# Patient Record
Sex: Male | Born: 1965 | Race: White | Hispanic: No | Marital: Married | Smoking: Former smoker
Health system: Southern US, Community
[De-identification: ages and names within clinical notes are randomized; demographics above are authoritative.]

## PROBLEM LIST (undated history)

## (undated) DIAGNOSIS — Z2821 Immunization not carried out because of patient refusal: Secondary | ICD-10-CM

## (undated) DIAGNOSIS — T7840XA Allergy, unspecified, initial encounter: Secondary | ICD-10-CM

## (undated) DIAGNOSIS — J45909 Unspecified asthma, uncomplicated: Secondary | ICD-10-CM

## (undated) DIAGNOSIS — Z5320 Procedure and treatment not carried out because of patient's decision for unspecified reasons: Secondary | ICD-10-CM

## (undated) DIAGNOSIS — Z87891 Personal history of nicotine dependence: Secondary | ICD-10-CM

## (undated) HISTORY — PX: NOSE SURGERY: SHX723

## (undated) HISTORY — PX: DENTAL SURGERY: SHX609

## (undated) HISTORY — PX: TONSILLECTOMY: SUR1361

## (undated) HISTORY — PX: BACK SURGERY: SHX140

## (undated) HISTORY — DX: Allergy, unspecified, initial encounter: T78.40XA

## (undated) HISTORY — PX: CERVICAL FUSION: SHX112

## (undated) HISTORY — PX: CARPAL TUNNEL RELEASE: SHX101

## (undated) HISTORY — DX: Immunization not carried out because of patient refusal: Z28.21

## (undated) HISTORY — DX: Personal history of nicotine dependence: Z87.891

## (undated) HISTORY — PX: VASECTOMY: SHX75

## (undated) HISTORY — DX: Procedure and treatment not carried out because of patient's decision for unspecified reasons: Z53.20

## (undated) HISTORY — DX: Unspecified asthma, uncomplicated: J45.909

---

## 1992-09-24 HISTORY — PX: COLONOSCOPY: SHX174

## 2012-12-10 ENCOUNTER — Ambulatory Visit (INDEPENDENT_AMBULATORY_CARE_PROVIDER_SITE_OTHER): Payer: BC Managed Care – PPO | Admitting: Family Medicine

## 2012-12-10 ENCOUNTER — Ambulatory Visit: Payer: BC Managed Care – PPO

## 2012-12-10 VITALS — BP 118/76 | HR 68 | Temp 97.9°F | Resp 12 | Ht 72.0 in | Wt 221.0 lb

## 2012-12-10 DIAGNOSIS — R062 Wheezing: Secondary | ICD-10-CM

## 2012-12-10 DIAGNOSIS — J45901 Unspecified asthma with (acute) exacerbation: Secondary | ICD-10-CM

## 2012-12-10 DIAGNOSIS — R05 Cough: Secondary | ICD-10-CM

## 2012-12-10 DIAGNOSIS — J309 Allergic rhinitis, unspecified: Secondary | ICD-10-CM

## 2012-12-10 DIAGNOSIS — R059 Cough, unspecified: Secondary | ICD-10-CM

## 2012-12-10 MED ORDER — PREDNISONE 20 MG PO TABS: ORAL_TABLET | ORAL | Status: DC

## 2012-12-10 MED ORDER — IPRATROPIUM BROMIDE 0.02 % IN SOLN
0.5000 mg | Freq: Once | RESPIRATORY_TRACT | Status: AC
  Administered 2012-12-10: 0.5 mg via RESPIRATORY_TRACT

## 2012-12-10 MED ORDER — ALBUTEROL SULFATE (2.5 MG/3ML) 0.083% IN NEBU: 2.5000 mg | INHALATION_SOLUTION | Freq: Four times a day (QID) | RESPIRATORY_TRACT | Status: DC | PRN

## 2012-12-10 MED ORDER — ALBUTEROL SULFATE (2.5 MG/3ML) 0.083% IN NEBU
2.5000 mg | INHALATION_SOLUTION | Freq: Once | RESPIRATORY_TRACT | Status: AC
  Administered 2012-12-10: 2.5 mg via RESPIRATORY_TRACT

## 2012-12-10 MED ORDER — FLUTICASONE PROPIONATE 50 MCG/ACT NA SUSP: 2.0000 | Freq: Every day | NASAL | Status: DC

## 2012-12-10 MED ORDER — ALBUTEROL SULFATE HFA 108 (90 BASE) MCG/ACT IN AERS: 2.0000 | INHALATION_SPRAY | RESPIRATORY_TRACT | Status: DC | PRN

## 2012-12-10 MED ORDER — METHYLPREDNISOLONE SODIUM SUCC 125 MG IJ SOLR
125.0000 mg | Freq: Once | INTRAMUSCULAR | Status: AC
  Administered 2012-12-10: 125 mg via INTRAMUSCULAR

## 2012-12-10 NOTE — Progress Notes (Signed)
  Subjective:    Patient ID: Kristopher Gomez, male    DOB: 01-Dec-1965, 47 y.o.   MRN: [id number]  HPI 47 year old male presents with 5 day history of acute asthma flare. States symptoms started on 12/06/12 and have progressively worsened.  Has history of asthma controlled with albuterol inhaler and nebulizer prn.  Triggered by his seasonal allergies and acute illnesses.  Normally takes loratadine and Flonase that do control his allergies well, but he has been off this "for a while."  He has been using his albuterol nebulizer q4-6hours which does help but only provides temporary relief.  Admits to nasal congestion and slight dry cough x 2 days.  No fever, chills, nausea, vomiting, sinus pain, otalgia, or headache.     Review of Systems  Constitutional: Negative for fever and chills.  HENT: Positive for congestion, rhinorrhea and postnasal drip. Negative for ear pain, sore throat and sinus pressure.   Respiratory: Positive for cough, shortness of breath and wheezing.   Cardiovascular: Negative for chest pain.  Gastrointestinal: Negative for vomiting.  Neurological: Negative for dizziness and headaches.       Objective:   Physical Exam  Constitutional: He is oriented to person, place, and time. He appears well-developed and well-nourished.  HENT:  Head: Normocephalic and atraumatic.  Right Ear: Hearing, tympanic membrane, external ear and ear canal normal.  Left Ear: Hearing, tympanic membrane, external ear and ear canal normal.  Mouth/Throat: Uvula is midline, oropharynx is clear and moist and mucous membranes are normal.  Eyes: Conjunctivae are normal.  Neck: Normal range of motion.  Cardiovascular: Normal rate, regular rhythm and normal heart sounds.   Pulmonary/Chest: Effort normal. He has wheezes (diffuse).  Wheezing improved significantly s/p nebulizer  Lymphadenopathy:    He has no cervical adenopathy.  Neurological: He is alert and oriented to person, place, and time.  Psychiatric:  He has a normal mood and affect. His behavior is normal. Judgment and thought content normal.     Patient reports improvement s/p nebulizer treatment.     [facility] reading (PRIMARY) by  Dr. Katrinka Blazing as no acute infiltration or consolidation.   Assessment & Plan:  1. Cough - Plan: DG Chest 2 View  -Due to RAD and allergies.   -Will treat conservatively. Ok to rx Levaquin 500 mg x 7 days if symptoms persist, unless development of fever or  hemoptysis.   2. Wheezing - Plan: methylPREDNISolone sodium succinate (SOLU-MEDROL) 125 mg/2 mL injection 125 mg, albuterol (PROVENTIL) (2.5 MG/3ML) 0.083% nebulizer solution 2.5 mg, ipratropium (ATROVENT) nebulizer solution 0.5 mg, predniSONE (DELTASONE) 20 MG tablet, albuterol (PROVENTIL HFA;VENTOLIN HFA) 108 (90 BASE) MCG/ACT inhaler, albuterol (PROVENTIL) (2.5 MG/3ML) 0.083% nebulizer solution  -Albuterol nebulizer q4-6hours as needed for wheezing  -Prednisone taper  3. Asthma with acute exacerbation - Plan: methylPREDNISolone sodium succinate (SOLU-MEDROL) 125 mg/2 mL injection 125 mg, predniSONE (DELTASONE) 20 MG tablet  -Continue current treatment plain  -Follow up if symptoms worsen or fail to improve.  4. Allergic rhinitis - Plan: fluticasone (FLONASE) 50 MCG/ACT nasal spray  -Continue Flonase and loratadine daily

## 2012-12-11 NOTE — Progress Notes (Signed)
History and physical exam reviewed in detail with Rhoderick Moody, PA-C.  CXR reviewed. Agree with current assessment and plan.  RTC for acute worsening.

## 2012-12-22 ENCOUNTER — Encounter: Payer: Self-pay | Admitting: Medical

## 2012-12-22 ENCOUNTER — Ambulatory Visit (INDEPENDENT_AMBULATORY_CARE_PROVIDER_SITE_OTHER): Payer: BC Managed Care – PPO | Admitting: Medical

## 2012-12-22 VITALS — BP 120/82 | HR 80 | Temp 98.1°F | Resp 16 | Ht 72.0 in | Wt 228.0 lb

## 2012-12-22 DIAGNOSIS — J45909 Unspecified asthma, uncomplicated: Secondary | ICD-10-CM

## 2012-12-22 DIAGNOSIS — J309 Allergic rhinitis, unspecified: Secondary | ICD-10-CM

## 2012-12-22 MED ORDER — FLUTICASONE PROPIONATE 50 MCG/ACT NA SUSP: 2.0000 | Freq: Every day | NASAL | Status: DC

## 2012-12-22 MED ORDER — BUDESONIDE-FORMOTEROL FUMARATE 160-4.5 MCG/ACT IN AERO: 2.0000 | INHALATION_SPRAY | Freq: Two times a day (BID) | RESPIRATORY_TRACT | Status: DC

## 2012-12-22 MED ORDER — VENTOLIN HFA 108 (90 BASE) MCG/ACT IN AERS: 2.0000 | INHALATION_SPRAY | Freq: Four times a day (QID) | RESPIRATORY_TRACT | Status: DC | PRN

## 2012-12-22 MED ORDER — MONTELUKAST SODIUM 10 MG PO TABS: 10.0000 mg | ORAL_TABLET | Freq: Every day | ORAL | Status: DC

## 2012-12-22 NOTE — Patient Instructions (Signed)
For better asthma control  Begin Singulair tablet daily  continue loratadine allergy pill daily or switch to Zyrtec daily  Continue Flonase nasal spray daily  Continue nasal salt water rinse daily such as in the morning and at bedtime  Begin Symbicort preventative inhaler, 2 puffs twice daily for PREVENTION  The Albutoerl/Ventolin is a RESCUE or emergency inhaler for wheezing and shortness of breath.   Recheck in 1 month.

## 2012-12-22 NOTE — Progress Notes (Signed)
Subjective: Here as a new patient today to establish care and deal with breathing issues.   Was seeing Dr. Pamala Hurry, primary care prior.    He has hx/o asthma.  His wife notes that he has problems breathing, raspy and wheezing at night.   Went to Urgent Care recently for asthma flare.  Just finished course of prednisone Friday for asthma flare.  Also started taking OTC supplement Yimin recently to help.   Has been on "a lot of prednisone" in the past.    He reports wheezing day and night. Diagnosed with asthma at 73 wk old.  Has seen asthma doctor, Dr. Ranell Patrick in the past.  He is former smoker.  Quite 17 years ago.  At times sinus and allergy problems are bad.    routinely uses flonase nasal spray, loratadine oral OTC.  Takes Turmic, Diatomaceous earth liquids, Yimin.  Has a pit bull and a min pin dog at home.  No cats at home.  No birds in the home.  Has carpet in bedrooms.  Dose not wear masks with cleaning or mowing.   In teh past was on Singulair.  Has used Proair and ventolin, likes ventolin better.  Has used Advair in the past.  Has used another red steroid inhaler starts with a D.     Past Medical History  Diagnosis Date  . Allergy   . Asthma    ROS as in subjective:  Objective: Filed Vitals:   12/22/12 1457  BP: 120/82  Pulse: 80  Temp: 98.1 F (36.7 C)  Resp: 16    General appearance: alert, no distress, WD/WN, white male, overweight HEENT: normocephalic, sclerae anicteric, right TM with serous fluid, left TM pearly, nares with turbinate edema, clear discharge, septum deviated to the left, pharynx normal Oral cavity: MMM, no lesions Neck: supple, no lymphadenopathy, no thyromegaly, no masses Heart: RRR, normal S1, S2, no murmurs Lungs: somewhat muffled, but overall clear, faint lower expiratory wheeze, no rhonchi, no rales Abdomen: +bs, soft, non tender, non distended, no masses, no hepatomegaly, no splenomegaly Pulses: 2+ symmetric, upper and lower extremities, normal cap  refill  Assessment: Encounter Diagnoses  Name Primary?  Marland Kitchen Asthma, chronic Yes  . Allergic rhinitis    Plan: He was last on Qvar without improvement in 07/2012.  reviewed recent urgent care note.  Begin Symbicort, gave demo, gave sample, begin Singulair, c/t Loratadine, c/t Flonase, c/t nasal saline daily, use mask when cleaning and mowing.  Avoid triggers where possible.  reviewed prior records.   Recheck 31mo.

## 2013-01-07 ENCOUNTER — Encounter: Payer: Self-pay | Admitting: Internal Medicine

## 2013-01-21 ENCOUNTER — Ambulatory Visit (INDEPENDENT_AMBULATORY_CARE_PROVIDER_SITE_OTHER): Payer: BC Managed Care – PPO | Admitting: Medical

## 2013-01-21 ENCOUNTER — Encounter: Payer: Self-pay | Admitting: Medical

## 2013-01-21 VITALS — BP 120/80 | HR 82 | Temp 98.2°F | Resp 16 | Wt 220.0 lb

## 2013-01-21 DIAGNOSIS — J309 Allergic rhinitis, unspecified: Secondary | ICD-10-CM

## 2013-01-21 DIAGNOSIS — J45909 Unspecified asthma, uncomplicated: Secondary | ICD-10-CM

## 2013-01-21 NOTE — Patient Instructions (Signed)
Please call insurer about coverage or their "preferred" combination asthma controller - Advair, Symbicort, and Dulera.   Let me know which one you need to go with.    Consider different pharmacy.     So, for now,  Continue Singulair, continue loratadine, continue Flonase nasal, continue nasal saline rinses/flushes  continue wearing mask when outside mowing  Either continue Symbicort, 2 puffs twice or Advair 1 puff twice daily, rinse mouth out with water after use.  Use the albuterol inhaler as needed   As summer approaches, if you are having to use albuterol less than twice per week or no night time symptoms, then we can try going without the Symbicort in general.

## 2013-01-21 NOTE — Progress Notes (Signed)
Subjective: Here for recheck on asthma and allergies.  I saw him as new patient 12/22/12 for same.  Since last visit been using the regimen we discussed.   Was using the Symbicort sample, but insurance denied the prescription .  Overall is much better, less wheezing and SOB since doing this regimen.  He is using albuterol 2-3 times per week once daily more of a preventative measure, not due to wheezing/SOB.    Diagnosed with asthma at 43 wk old.  Has seen asthma doctor, Dr. Ranell Patrick in the past.  He is former smoker.  Quite 17 years ago.  At times sinus and allergy problems are bad.  Has a pit bull and a min pin dog at home.  No cats at home.  No birds in the home.  Has carpet in bedrooms.  Dose not wear masks with cleaning or mowing.    Past Medical History  Diagnosis Date  . Allergy   . Asthma    ROS as in subjective:  Objective: Filed Vitals:   01/21/13 1043  BP: 120/80  Pulse: 82  Temp: 98.2 F (36.8 C)  Resp: 16    General appearance: alert, no distress, WD/WN HEENT: normocephalic, sclerae anicteric, TMs pearly, nares patent without edema,no discharge, septum deviated to the left, pharynx normal Oral cavity: MMM, no lesions Neck: supple, no lymphadenopathy, no thyromegaly, no masses Heart: RRR, normal S1, S2, no murmurs Lungs: clear, no wheeze, no rhonchi, no rales   Assessment: Encounter Diagnoses  Name Primary?  Marland Kitchen Unspecified asthma Yes  . Allergic rhinitis      Plan: Patient Instructions  Please call insurer about coverage or their "preferred" combination asthma controller - Advair, Symbicort, and Dulera.   Let me know which one you need to go with.    Consider different pharmacy.     So, for now,  Continue Singulair, continue loratadine, continue Flonase nasal, continue nasal saline rinses/flushes  continue wearing mask when outside mowing  Either continue Symbicort, 2 puffs twice or Advair 1 puff twice daily, rinse mouth out with water after use.  Use the  albuterol inhaler as needed   As summer approaches, if you are having to use albuterol less than twice per week or no night time symptoms, then we can try going without the Symbicort in general.

## 2013-03-23 ENCOUNTER — Other Ambulatory Visit: Payer: Self-pay | Admitting: Medical

## 2013-03-23 ENCOUNTER — Telehealth: Payer: Self-pay | Admitting: Medical

## 2013-03-23 MED ORDER — MONTELUKAST SODIUM 10 MG PO TABS: 10.0000 mg | ORAL_TABLET | Freq: Every day | ORAL | Status: DC

## 2013-03-23 MED ORDER — VENTOLIN HFA 108 (90 BASE) MCG/ACT IN AERS: 2.0000 | INHALATION_SPRAY | Freq: Four times a day (QID) | RESPIRATORY_TRACT | Status: DC | PRN

## 2013-03-23 MED ORDER — BUDESONIDE-FORMOTEROL FUMARATE 160-4.5 MCG/ACT IN AERO: 2.0000 | INHALATION_SPRAY | Freq: Two times a day (BID) | RESPIRATORY_TRACT | Status: DC

## 2013-03-23 MED ORDER — FLUTICASONE PROPIONATE 50 MCG/ACT NA SUSP: 2.0000 | Freq: Every day | NASAL | Status: DC

## 2013-03-24 NOTE — Telephone Encounter (Signed)
LM

## 2013-04-01 ENCOUNTER — Telehealth: Payer: Self-pay | Admitting: Internal Medicine

## 2013-04-01 MED ORDER — FLUTICASONE PROPIONATE 50 MCG/ACT NA SUSP: 2.0000 | Freq: Every day | NASAL | Status: DC

## 2013-04-01 MED ORDER — VENTOLIN HFA 108 (90 BASE) MCG/ACT IN AERS: 2.0000 | INHALATION_SPRAY | Freq: Four times a day (QID) | RESPIRATORY_TRACT | Status: DC | PRN

## 2013-04-01 MED ORDER — MONTELUKAST SODIUM 10 MG PO TABS: 10.0000 mg | ORAL_TABLET | Freq: Every day | ORAL | Status: DC

## 2013-04-01 MED ORDER — BUDESONIDE-FORMOTEROL FUMARATE 160-4.5 MCG/ACT IN AERO: 2.0000 | INHALATION_SPRAY | Freq: Two times a day (BID) | RESPIRATORY_TRACT | Status: DC

## 2013-04-01 NOTE — Telephone Encounter (Signed)
[facility] so i sent them to [address]

## 2013-10-02 ENCOUNTER — Other Ambulatory Visit: Payer: Self-pay | Admitting: Medical

## 2013-10-05 ENCOUNTER — Other Ambulatory Visit: Payer: Self-pay | Admitting: Medical

## 2013-10-05 NOTE — Telephone Encounter (Signed)
PATIENT NEEDS A FOLLOW UP APPOINTMENT

## 2013-10-08 ENCOUNTER — Other Ambulatory Visit: Payer: Self-pay | Admitting: Medical

## 2013-10-08 NOTE — Telephone Encounter (Signed)
Left message for patient to return my call to schedule med check with Vincenza HewsShane as he was due August 2014, before I can refill his Symbicort.

## 2013-10-09 ENCOUNTER — Encounter: Payer: Self-pay | Admitting: Medical

## 2013-10-09 ENCOUNTER — Ambulatory Visit (INDEPENDENT_AMBULATORY_CARE_PROVIDER_SITE_OTHER): Payer: BC Managed Care – PPO | Admitting: Surgery

## 2013-10-09 ENCOUNTER — Encounter (INDEPENDENT_AMBULATORY_CARE_PROVIDER_SITE_OTHER): Payer: Self-pay | Admitting: Surgery

## 2013-10-09 ENCOUNTER — Telehealth: Payer: Self-pay | Admitting: Family Medicine

## 2013-10-09 ENCOUNTER — Ambulatory Visit (INDEPENDENT_AMBULATORY_CARE_PROVIDER_SITE_OTHER): Payer: BC Managed Care – PPO | Admitting: Medical

## 2013-10-09 VITALS — BP 122/84 | HR 80 | Temp 97.7°F | Resp 14 | Ht 73.0 in | Wt 211.8 lb

## 2013-10-09 VITALS — BP 110/80 | HR 78 | Temp 98.6°F | Resp 16 | Wt 209.0 lb

## 2013-10-09 DIAGNOSIS — K6289 Other specified diseases of anus and rectum: Secondary | ICD-10-CM

## 2013-10-09 DIAGNOSIS — K649 Unspecified hemorrhoids: Secondary | ICD-10-CM

## 2013-10-09 DIAGNOSIS — K645 Perianal venous thrombosis: Secondary | ICD-10-CM

## 2013-10-09 MED ORDER — TRAMADOL HCL 50 MG PO TABS
50.0000 mg | ORAL_TABLET | Freq: Four times a day (QID) | ORAL | Status: DC | PRN
Start: 2013-10-09 — End: 2013-11-16

## 2013-10-09 MED ORDER — NAPROXEN 500 MG PO TABS: 500.0000 mg | ORAL_TABLET | Freq: Two times a day (BID) | ORAL | Status: DC

## 2013-10-09 NOTE — Telephone Encounter (Signed)
[facility] @ 315 pm to Dr. Michaell CowingGross. [phone]. CLS

## 2013-10-09 NOTE — Progress Notes (Signed)
Subjective:     Patient ID: Kristopher Gomez, male   DOB: Apr 09, 1966, 48 y.o.   MRN: [id number]  HPI  Note: This dictation was prepared with Dragon/digital dictation along with [facility] technology. Any transcriptional errors that result from this process are unintentional.       Kristopher Gomez  August 09, 1966 [id number]  Patient Care Team: Jac Canavan, PA-C as PCP - General (Family Medicine)  This patient is a 48 y.o.male who presents today for surgical evaluation at the request of Kristopher Gomez.   Reason for visit: Painful thrombosed hemorrhoid  Pleasant male who works for [employer] with moderate lifting.  He has had rectal bleeding in the past.  Had a workup when he lived up in [place] including a normal colonoscopy.  It resolved.  Hemorrhoids are usually not been them bothersome.  However, last week he an episode of sharp pain.  He spelled swelling and in a month.  The pain has been exquisitely tender.  Based on concerns, surgical consultation requested.  He normally has a bowel movement every day.  Occasionally have some irregular bowels.  No personal nor family history of GI/colon cancer, inflammatory bowel disease, irritable bowel syndrome, allergy such as Celiac Sprue, dietary/dairy problems, colitis, ulcers nor gastritis.  No recent sick contacts/gastroenteritis.  No travel outside the country.  No changes in diet.  No dysphagia to solids or liquids.  No significant heartburn or reflux.  No hematochezia, hematemesis, coffee ground emesis.  No evidence of prior gastric/peptic ulceration.  No history of prior hemorrhoid surgery or anal rectal interventions.  No history of fissure/fistula/warts    Patient Active Problem List   Diagnosis Date Noted  . Hemorrhoids, internal, thrombosed 10/09/2013    Past Medical History  Diagnosis Date  . Allergy   . Asthma     Past Surgical History  Procedure Laterality Date  . Cervical fusion    . Vasectomy    . Carpal tunnel release    . Dental  surgery      all teeth  . Nose surgery      broke nose / sinus cavity    History   Social History  . Marital Status: Married    Spouse Name: N/A    Number of Children: N/A  . Years of Education: N/A   Occupational History  . Not on file.   Social History Main Topics  . Smoking status: Former Smoker    Quit date: 09/25/1995  . Smokeless tobacco: Never Used  . Alcohol Use: No  . Drug Use: No  . Sexual Activity: Yes    Birth Control/ Protection: Surgical   Other Topics Concern  . Not on file   Social History Narrative  . No narrative on file    Family History  Problem Relation Age of Onset  . Asthma Father   . Asthma Brother   . Cancer Daughter     ALL LEUKEMIA  . Cancer Maternal Aunt     breast  . Cancer Maternal Aunt     breast  . Cancer Maternal Aunt     breast    Current Outpatient Prescriptions  Medication Sig Dispense Refill  . albuterol (PROVENTIL HFA;VENTOLIN HFA) 108 (90 BASE) MCG/ACT inhaler Inhale 2 puffs into the lungs every 6 (six) hours as needed for wheezing.      . fluticasone (FLONASE) 50 MCG/ACT nasal spray Place 2 sprays into the nose daily.  48 g  1  . GARCINIA CAMBOGIA-CHROMIUM PO Take by mouth.      Marland Kitchen  montelukast (SINGULAIR) 10 MG tablet TAKE 1 TABLET (10 MG TOTAL) BY MOUTH AT BEDTIME.  90 tablet  0  . OVER THE COUNTER MEDICATION       . SYMBICORT 160-4.5 MCG/ACT inhaler INHALE 2 PUFFS INTO THE LUNGS 2 (TWO) TIMES DAILY.  6 g  0  . VENTOLIN HFA 108 (90 BASE) MCG/ACT inhaler Inhale 2 puffs into the lungs every 6 (six) hours as needed for wheezing.  3 Inhaler  0  . naproxen (NAPROSYN) 500 MG tablet Take 1 tablet (500 mg total) by mouth 2 (two) times daily with a meal.  40 tablet  1  . traMADol (ULTRAM) 50 MG tablet Take 1-2 tablets (50-100 mg total) by mouth every 6 (six) hours as needed for moderate pain or severe pain.  30 tablet  0   No current facility-administered medications for this visit.     Allergies  Allergen Reactions  .  Vicodin [Hydrocodone-Acetaminophen] Anaphylaxis  . Morphine And Related Nausea And Vomiting  . Penicillins     BP 122/84  Pulse 80  Temp(Src) 97.7 F (36.5 C) (Temporal)  Resp 14  Ht 6\' 1"  (1.854 m)  Wt 211 lb 12.8 oz (96.072 kg)  BMI 27.95 kg/m2  No results found.   Review of Systems  Constitutional: Negative for fever, chills and diaphoresis.  HENT: Negative for ear discharge, facial swelling, mouth sores, nosebleeds, sore throat and trouble swallowing.   Eyes: Negative for photophobia, discharge and visual disturbance.  Respiratory: Negative for choking, chest tightness, shortness of breath and stridor.   Cardiovascular: Negative for chest pain and palpitations.  Gastrointestinal: Positive for diarrhea, constipation and rectal pain. Negative for nausea, vomiting, abdominal pain, blood in stool, abdominal distention and anal bleeding.  Endocrine: Negative for cold intolerance and heat intolerance.  Genitourinary: Negative for dysuria, urgency, difficulty urinating and testicular pain.  Musculoskeletal: Negative for arthralgias, back pain, gait problem and myalgias.  Skin: Negative for color change, pallor, rash and wound.  Allergic/Immunologic: Negative for environmental allergies and food allergies.  Neurological: Negative for dizziness, speech difficulty, weakness, numbness and headaches.  Hematological: Negative for adenopathy. Does not bruise/bleed easily.  Psychiatric/Behavioral: Negative for hallucinations, confusion and agitation.       Objective:   Physical Exam  Constitutional: He is oriented to person, place, and time. He appears well-developed and well-nourished. No distress.  HENT:  Head: Normocephalic.  Mouth/Throat: Oropharynx is clear and moist. No oropharyngeal exudate.  Eyes: Conjunctivae and EOM are normal. Pupils are equal, round, and reactive to light. No scleral icterus.  Neck: Normal range of motion. Neck supple. No tracheal deviation present.    Cardiovascular: Normal rate, regular rhythm and intact distal pulses.   Pulmonary/Chest: Effort normal and breath sounds normal. No respiratory distress.  Abdominal: Soft. He exhibits no distension. There is no tenderness. Hernia confirmed negative in the right inguinal area and confirmed negative in the left inguinal area.  Genitourinary:     Exam done with assistance of male Medical Assistant in the room.  Perianal skin clean with good hygiene.  No pruritis.  No pilonidal disease.  No fissure.  No abscess/fistula.    Large R post prolapsed & thrombosed homorrhoid.  Normal sphincter tone.  Tolerates digital rectal exam.  No rectal masses.  Hemorrhoidal piles otherise mildly enlarged  Musculoskeletal: Normal range of motion. He exhibits no tenderness.  Lymphadenopathy:    He has no cervical adenopathy.       Right: No inguinal adenopathy present.  Left: No inguinal adenopathy present.  Neurological: He is alert and oriented to person, place, and time. No cranial nerve deficit. He exhibits normal muscle tone. Coordination normal.  Skin: Skin is warm and dry. No rash noted. He is not diaphoretic. No erythema. No pallor.  Psychiatric: He has a normal mood and affect. His behavior is normal. Judgment and thought content normal.       Assessment:     Large prolapsed thrombosed hemorrhoid     Plan:     I recommended incision and drainage with excision in the office.  He and his significant other agreed:  The anatomy & physiology of the anorectal region was discussed.  The pathophysiology of hemorrhoids and differential diagnosis was discussed.  Natural history progression  of worsening swelling with eventual resolution over weeks to months was discussed.   I stressed the importance of a bowel regimen to have daily soft bowel movements to minimize progression of disease.     The patient's symptoms are not adequately controlled.  Therefore, I recommended incision to drain the  thrombosed hemorrhoid..  I went over the technique, risks, benefits, and alternatives.   Questions were answered.  The patient expressed understanding & wished to proceed.  The patient was positioned in the lateral decubitus position.  I placed a field block of local anaesthetic.  Perianal & rectal examination was done.  I incised & decompressed the thrombosed hemorrhoid.  He large volume of redundant tissue in many clusters of thrombosed blood vessels.  Sharply debrided and the entire hemorrhoid pocket and external hemorrhoid component to a more normal size.  I believe generous anoderm.  I closed it with a 3-0 chromic running suture.The patient tolerated the procedure well.    Educational handouts further explaining the pathology, treatment options, and bowel regimen were given as well.

## 2013-10-09 NOTE — Progress Notes (Signed)
Subjective: Here for hemorrhoid.  He notes hx/o hemorrhoid.  First time in 6 years he has a big and painful hemorrhoid.   Wednesday 2 days ago had several hemorrhoids flare up, 2 went down, but this one is big and painful.  Pink coloration.  Gets nontender hemorrhoids frequently.   Usually uses preparation H with relief, but this one is very uncomfortable.   Normally has 2-3 BM daily, not hard and bulky.  No constipation issues.  He reports drinking lots of water and eating a lot of fiber daily.  No prior excision or thrombosed hemorrhoid.  Lifts boxes daily, works at [employer].  Past Medical History  Diagnosis Date  . Allergy   . Asthma    ROS as in subjective  Objective: Gen: wd, wn, nad Rectal: left of anus with large tense, tender hemorrhoid, pink with some friable appearance, but no purplish coloration, there are several other small hemorrhoids present as well, nontender, no obvious fissure   Assessment: Encounter Diagnoses  Name Primary?  . Hemorrhoids Yes  . Rectal pain     Assessment: Based on the numerous hemorrhoids, the large tense painful hemorrhoid, will refer to general surgery, got him appt today.

## 2013-10-09 NOTE — Patient Instructions (Signed)
ANORECTAL SURGERY: POST OP INSTRUCTIONS  1. Take your usually prescribed home medications unless otherwise directed. 2. DIET: Follow a light bland diet the first 24 hours after arrival home, such as soup, liquids, crackers, etc.  Be sure to include lots of fluids daily.  Avoid fast food or heavy meals as your are more likely to get nauseated.  Eat a low fat the next few days after surgery.   3. PAIN CONTROL: a. Pain is best controlled by a usual combination of three different methods TOGETHER: i. Ice/Heat ii. Over the counter pain medication iii. Prescription pain medication b. Most patients will experience some swelling and discomfort in the anus/rectal area. and incisions.  Ice packs or heat (30-60 minutes up to 6 times a day) will help. Use ice for the first few days to help decrease swelling and bruising, then switch to heat such as warm towels, sitz baths, warm baths, etc to help relax tight/sore spots and speed recovery.  Some people prefer to use ice alone, heat alone, alternating between ice & heat.  Experiment to what works for you.  Swelling and bruising can take several weeks to resolve.   c. It is helpful to take an over-the-counter pain medication regularly for the first few weeks.  Choose one of the following that works best for you: i. Naproxen (Aleve, etc)  Two 220mg tabs twice a day ii. Ibuprofen (Advil, etc) Three 200mg tabs four times a day (every meal & bedtime) iii. Acetaminophen (Tylenol, etc) 500-650mg four times a day (every meal & bedtime) d. A  prescription for pain medication (such as oxycodone, hydrocodone, etc) should be given to you upon discharge.  Take your pain medication as prescribed.  i. If you are having problems/concerns with the prescription medicine (does not control pain, nausea, vomiting, rash, itching, etc), please call us [phone] to see if we need to switch you to a different pain medicine that will work better for you and/or control your side effect  better. ii. If you need a refill on your pain medication, please contact your pharmacy.  They will contact our office to request authorization. Prescriptions will not be filled after 5 pm or on week-ends.  Use a Sitz Bath 4-8 times a day for relief A sitz bath is a warm water bath taken in the sitting position that covers only the hips and buttocks. It may be used for either healing or hygiene purposes. Sitz baths are also used to relieve pain, itching, or muscle spasms. The water may contain medicine. Moist heat will help you heal and relax.  HOME CARE INSTRUCTIONS  Take 3 to 4 sitz baths a day. 1. Fill the bathtub half full with warm water. 2. Sit in the water and open the drain a little. 3. Turn on the warm water to keep the tub half full. Keep the water running constantly. 4. Soak in the water for 15 to 20 minutes. 5. After the sitz bath, pat the affected area dry first. SEEK MEDICAL CARE IF:  You get worse instead of better. Stop the sitz baths if you get worse.   4. KEEP YOUR BOWELS REGULAR a. The goal is one bowel movement a day b. Avoid getting constipated.  Between the surgery and the pain medications, it is common to experience some constipation.  Increasing fluid intake and taking a fiber supplement (such as Metamucil, Citrucel, FiberCon, MiraLax, etc) 1-2 times a day regularly will usually help prevent this problem from occurring.  A mild   laxative (prune juice, Milk of Magnesia, MiraLax, etc) should be taken according to package directions if there are no bowel movements after 48 hours. c. Watch out for diarrhea.  If you have many loose bowel movements, simplify your diet to bland foods & liquids for a few days.  Stop any stool softeners and decrease your fiber supplement.  Switching to mild anti-diarrheal medications (Kayopectate, Pepto Bismol) can help.  If this worsens or does not improve, please call us.  5. Wound Care a. Remove your bandages the day after surgery.  Unless  discharge instructions indicate otherwise, leave your bandage dry and in place overnight.  Remove the bandage during your first bowel movement.   b. Allow the wound packing to fall out over the next few days.  You can trim exposed gauze / ribbon as it falls out.  You do not need to repack the wound unless instructed otherwise.  Wear an absorbent pad or soft cotton gauze in your underwear as needed to catch any drainage and help keep the area  c. Keep the area clean and dry.  Bathe / shower every day.  Keep the area clean by showering / bathing over the incision / wound.   It is okay to soak an open wound to help wash it.  Wet wipes or showers / gentle washing after bowel movements is often less traumatic than regular toilet paper. d. You may have some styrofoam-like soft packing in the rectum which will come out with the first bowel movement.  e. You will often notice bleeding with bowel movements.  This should slow down by the end of the first week of surgery f. Expect some drainage.  This should slow down, too, by the end of the first week of surgery.  Wear an absorbent pad or soft cotton gauze in your underwear until the drainage stops. 6. ACTIVITIES as tolerated:   a. You may resume regular (light) daily activities beginning the next day-such as daily self-care, walking, climbing stairs-gradually increasing activities as tolerated.  If you can walk 30 minutes without difficulty, it is safe to try more intense activity such as jogging, treadmill, bicycling, low-impact aerobics, swimming, etc. b. Save the most intensive and strenuous activity for last such as sit-ups, heavy lifting, contact sports, etc  Refrain from any heavy lifting or straining until you are off narcotics for pain control.   c. DO NOT PUSH THROUGH PAIN.  Let pain be your guide: If it hurts to do something, don't do it.  Pain is your body warning you to avoid that activity for another week until the pain goes down. d. You may drive when  you are no longer taking prescription pain medication, you can comfortably sit for long periods of time, and you can safely maneuver your car and apply brakes. e. You may have sexual intercourse when it is comfortable.  7. FOLLOW UP in our office a. Please call [facility] at [phone] to set up an appointment to see your surgeon in the office for a follow-up appointment approximately 2 weeks after your surgery. b. Make sure that you call for this appointment the day you arrive home to insure a convenient appointment time. 10. IF YOU HAVE DISABILITY OR FAMILY LEAVE FORMS, BRING THEM TO THE OFFICE FOR PROCESSING.  DO NOT GIVE THEM TO YOUR DOCTOR.        WHEN TO CALL US [phone]: 1. Poor pain control 2. Reactions / problems with new medications (rash/itching, nausea, etc)    3. Fever over 101.5 F (38.5 C) 4. Inability to urinate 5. Nausea and/or vomiting 6. Worsening swelling or bruising 7. Continued bleeding from incision. 8. Increased pain, redness, or drainage from the incision  The clinic staff is available to answer your questions during regular business hours (8:30am-5pm).  Please don't hesitate to call and ask to speak to one of our nurses for clinical concerns.   A surgeon from [facility] is always on call at the hospitals   If you have a medical emergency, go to the nearest emergency room or call 911.    [facility] [address], [address], [place], [place]  [address] ? MAIN: [phone] ? TOLL FREE: [phone] ? FAX [phone] [email or web address]  HEMORRHOIDS  The rectum is the last foot of your colon, and it naturally stretches to hold stool.  Hemorrhoidal piles are natural clusters of blood vessels that help the rectum and anal canal stretch to hold stool and allow bowel movements to eliminate feces.   Hemorrhoids are abnormally swollen blood vessels in the rectum.  Too much pressure in the rectum causes  hemorrhoids by forcing blood to stretch and bulge the walls of the veins, sometimes even rupturing them.  Hemorrhoids can become like varicose veins you might see on a person's legs.  Most people will develop a flare of hemorrhoids in their lifetime.  When bulging hemorrhoidal veins are irritated, they can swell, burn, itch, cause pain, and bleed.  Most flares will calm down gradually own within a few weeks.  However, once hemorrhoids are created, they are difficult to get rid of completely and tend to flare more easily than the first flare.   Fortunately, good habits and simple medical treatment usually control hemorrhoids well, and surgery is needed only in severe cases. Types of Hemorrhoids:  Internal hemorrhoids usually don't initially hurt or itch; they are deep inside the rectum and usually have no sensation. If they begin to push out (prolapse), pain and burning can occur.  However, internal hemorrhoids can bleed.  Anal bleeding should not be ignored since bleeding could come from a dangerous source like colorectal cancer, so persistent rectal bleeding should be investigated by a doctor, sometimes with a colonoscopy.  External hemorrhoids cause most of the symptoms - pain, burning, and itching. Nonirritated hemorrhoids can look like small skin tags coming out of the anus.   Thrombosed hemorrhoids can form when a hemorrhoid blood vessel bursts and causes the hemorrhoid to suddenly swell.  A purple blood clot can form in it and become an excruciatingly painful lump at the anus. Because of these unpleasant symptoms, immediate incision and drainage by a surgeon at an office visit can provide much relief of the pain.    PREVENTION Avoiding the most frequent causes listed below will prevent most cases of hemorrhoids: Constipation Hard stools Diarrhea  Constant sitting  Straining with bowel movements Sitting on the toilet for a long time  Severe coughing  episodes Pregnancy / Childbirth  Heavy  Lifting  Sometimes avoiding the above triggers is difficult:  How can you avoid sitting all day if you have a seated job? Also, we try to avoid coughing and diarrhea, but sometimes it's beyond your control.  Still, there are some practical hints to help: Keep the anal and genital area clean.  Moistened tissues such as flushable wet wipes are less irritating than toilet paper.  Using irrigating showers or bottle irrigation washing gently cleans this sensitive area.     Avoid dry toilet paper when cleaning after bowel movements.  . Keep the anal and genital area dry.  Lightly pat the rectal area dry.  Avoid rubbing.  Talcum or baby powders can help GET YOUR STOOLS SOFT.   This is the most important way to prevent irritated hemorrhoids.  Hard stools are like sandpaper to the anorectal canal and will cause more problems.  The goal: ONE SOFT BOWEL MOVEMENT A DAY!  BMs from every other day to 3 times a day is a tolerable range Treat coughing, diarrhea and constipation early since irritated hemorrhoids may soon follow.  If your main job activity is seated, always stand or walk during your breaks. Make it a point to stand and walk at least 5 minutes every hour and try to shift frequently in your chair to avoid direct rectal pressure.  Always exhale as you strain or lift. Don't hold your breath.  Do not delay or try to prevent a bowel movement when the urge is present. Exercise regularly (walking or jogging 60 minutes a day) to stimulate the bowels to move. No reading or other activity while on the toilet. If bowel movements take longer than 5 minutes, you are too constipated. AVOID CONSTIPATION Drink plenty of liquids (1 1/2 to 2 quarts of water and other fluids a day unless fluid restricted for another medical condition). Liquids that contain caffeine (coffee a, tea, soft drinks) can be dehydrating and should be avoided until constipation is controlled. Consider minimizing milk, as dairy products may be  constipating. Eat plenty of fiber (30g a day ideal, more if needed).  Fiber is the undigested part of plant food that passes into the colon, acting as "natures broom" to encourage bowel motility and movement.  Fiber can absorb and hold large amounts of water. This results in a larger, bulkier stool, which is soft and easier to pass.  Eating foods high in fiber - 12 servings - such as  Vegetables: Root (potatoes, carrots, turnips), Leafy green (lettuce, salad greens, celery, spinach), High residue (cabbage, broccoli, etc.) Fruit: Fresh, Dried (prunes, apricots, cherries), Stewed (applesauce)  Whole grain breads, pasta, whole wheat Bran cereals, muffins, etc. Consider adding supplemental bulking fiber which retains large volumes of water: Psyllium ground seeds (native plant from [place])--available as Metamucil, Konsyl, Effersyllium, Per Diem Fiber, or the less expensive generic forms.  Citrucel  (methylcellulose wood fiber) . FiberCon (Polycarbophil) Polyethylene Glycol - and "artificial" fiber commonly called Miralax or Glycolax.  It is helpful for people with gassy or bloated feelings with regular fiber Flax Seed - a less gassy natural fiber  Laxatives can be useful for a short period if constipation is severe Osmotics (Milk of Magnesia, Fleets Phospho-Soda, Magnesium Citrate)  Stimulants (Senokot,   Castor Oil,  Dulcolax, Ex-Lax)    Laxatives are not a good long-term solution as it can stress the bowels and cause too much mineral loss and dehydration.   Avoid taking laxatives for more than 7 days in a row.  AVOID DIARRHEA Switch to liquids and simpler foods for a few days to avoid stressing your intestines further. Avoid dairy products (especially milk & ice cream) for a short time.  The intestines often can lose the ability to digest lactose when stressed. Avoid foods that cause gassiness or bloating.  Typical foods include beans and other legumes, cabbage, broccoli, and dairy foods.   Every person has some sensitivity to other foods, so listen to your body and avoid those foods that trigger problems for   you. Adding fiber (Citrucel, Metamucil, FiberCon, Flax seed, Miralax) gradually can help thicken stools by absorbing excess fluid and retrain the intestines to act more normally.  Slowly increase the dose over a few weeks.  Too much fiber too soon can backfire and cause cramping & bloating. Probiotics (such as active yogurt, Align, etc) may help repopulate the intestines and colon with normal bacteria and calm down a sensitive digestive tract.  Most studies show it to be of mild help, though, and such products can be costly. Medicines: Bismuth subsalicylate (ex. Kayopectate, Pepto Bismol) every 30 minutes for up to 6 doses can help control diarrhea.  Avoid if pregnant. Loperamide (Immodium) can slow down diarrhea.  Start with two tablets (4mg total) first and then try one tablet every 6 hours.  Avoid if you are having fevers or severe pain.  If you are not better or start feeling worse, stop all medicines and call your doctor for advice Call your doctor if you are getting worse or not better.  Sometimes further testing (cultures, endoscopy, X-ray studies, bloodwork, etc) may be needed to help diagnose and treat the cause of the diarrhea. TREATMENT OF HEMORRHOID FLARE If these preventive measures fail, you must take action right away! Hemorrhoids are one condition that can be mild in the morning and become intolerable by nightfall. Most hemorrhoidal flares take several weeks to calm down.  These suggestions can help: Warm soaks.  This helps more than any topical medication.  Use up to 8 times a day.  Usually sitz baths or sitting in a warm bathtub helps.  Sitting on moist warm towels are helpful.  Switching to ice packs/cool compresses can be helpful  Use a Sitz Bath 4-8 times a day for relief A sitz bath is a warm water bath taken in the sitting position that covers only the hips and  buttocks. It may be used for either healing or hygiene purposes. Sitz baths are also used to relieve pain, itching, or muscle spasms. The water may contain medicine. Moist heat will help you heal and relax.  HOME CARE INSTRUCTIONS  Take 3 to 4 sitz baths a day. 6. Fill the bathtub half full with warm water. 7. Sit in the water and open the drain a little. 8. Turn on the warm water to keep the tub half full. Keep the water running constantly. 9. Soak in the water for 15 to 20 minutes. 10. After the sitz bath, pat the affected area dry first. SEEK MEDICAL CARE IF:  You get worse instead of better. Stop the sitz baths if you get worse.  Normalize your bowels.  Extremes of diarrhea or constipation will make hemorrhoids worse.  One soft bowel movement a day is the goal.  Fiber can help get your bowels regular Wet wipes instead of toilet paper Pain control with a NSAID such as ibuprofen (Advil) or naproxen (Aleve) or acetaminophen (Tylenol) around the clock.  Narcotics are constipating and should be minimized if possible Topical creams contain steroids (bydrocortisone) or local anesthetic (xylocaine) can help make pain and itching more tolerable.   EVALUATION If hemorrhoids are still causing problems, you could benefit by an evaluation by a surgeon.  The surgeon will obtain a history and examine you.  If hemorrhoids are diagnosed, some therapies can be offered in the office, usually with an anoscope into the less sensitive area of the rectum: -injection of hemorrhoids (sclerotherapy) can scar the blood vessels of the swollen/enlarged hemorrhoids to help shrink them down to   a more normal size -rubber banding of the enlarged hemorrhoids to help shrink them down to a more normal size -drainage of the blood clot causing a thrombosed hemorrhoid,  to relieve the severe pain   While 90% of the time such problems from hemorrhoids can be managed without preceding to surgery, sometimes the hemorrhoids require a  operation to control the problem (uncontrolled bleeding, prolapse, pain, etc.).   This involves being placed under general anesthesia where the surgeon can confirm the diagnosis and remove, suture, or staple the hemorrhoid(s).  Your surgeon can help you treat the problem appropriately.    GETTING TO GOOD BOWEL HEALTH. Irregular bowel habits such as constipation and diarrhea can lead to many problems over time.  Having one soft bowel movement a day is the most important way to prevent further problems.  The anorectal canal is designed to handle stretching and feces to safely manage our ability to get rid of solid waste (feces, poop, stool) out of our body.  BUT, hard constipated stools can act like ripping concrete bricks and diarrhea can be a burning fire to this very sensitive area of our body, causing inflamed hemorrhoids, anal fissures, increasing risk is perirectal abscesses, abdominal pain/bloating, an making irritable bowel worse.     The goal: ONE SOFT BOWEL MOVEMENT A DAY!  To have soft, regular bowel movements:    Drink at least 8 tall glasses of water a day.     Take plenty of fiber.  Fiber is the undigested part of plant food that passes into the colon, acting s "natures broom" to encourage bowel motility and movement.  Fiber can absorb and hold large amounts of water. This results in a larger, bulkier stool, which is soft and easier to pass. Work gradually over several weeks up to 6 servings a day of fiber (25g a day even more if needed) in the form of: o Vegetables -- Root (potatoes, carrots, turnips), leafy green (lettuce, salad greens, celery, spinach), or cooked high residue (cabbage, broccoli, etc) o Fruit -- Fresh (unpeeled skin & pulp), Dried (prunes, apricots, cherries, etc ),  or stewed ( applesauce)  o Whole grain breads, pasta, etc (whole wheat)  o Bran cereals    Bulking Agents -- This type of water-retaining fiber generally is easily obtained each day by one of the following:   o Psyllium bran -- The psyllium plant is remarkable because its ground seeds can retain so much water. This product is available as Metamucil, Konsyl, Effersyllium, Per Diem Fiber, or the less expensive generic preparation in drug and health food stores. Although labeled a laxative, it really is not a laxative.  o Methylcellulose -- This is another fiber derived from wood which also retains water. It is available as Citrucel. o Polyethylene Glycol - and "artificial" fiber commonly called Miralax or Glycolax.  It is helpful for people with gassy or bloated feelings with regular fiber o Flax Seed - a less gassy fiber than psyllium   No reading or other relaxing activity while on the toilet. If bowel movements take longer than 5 minutes, you are too constipated   AVOID CONSTIPATION.  High fiber and water intake usually takes care of this.  Sometimes a laxative is needed to stimulate more frequent bowel movements, but    Laxatives are not a good long-term solution as it can wear the colon out. o Osmotics (Milk of Magnesia, Fleets phosphosoda, Magnesium citrate, MiraLax, GoLytely) are safer than  o Stimulants (Senokot, Castor Oil, Dulcolax,   Ex Lax)    o Do not take laxatives for more than 7days in a row.    IF SEVERELY CONSTIPATED, try a Bowel Retraining Program: o Do not use laxatives.  o Eat a diet high in roughage, such as bran cereals and leafy vegetables.  o Drink six (6) ounces of prune or apricot juice each morning.  o Eat two (2) large servings of stewed fruit each day.  o Take one (1) heaping tablespoon of a psyllium-based bulking agent twice a day. Use sugar-free sweetener when possible to avoid excessive calories.  o Eat a normal breakfast.  o Set aside 15 minutes after breakfast to sit on the toilet, but do not strain to have a bowel movement.  o If you do not have a bowel movement by the third day, use an enema and repeat the above steps.    Controlling diarrhea o Switch to liquids and  simpler foods for a few days to avoid stressing your intestines further. o Avoid dairy products (especially milk & ice cream) for a short time.  The intestines often can lose the ability to digest lactose when stressed. o Avoid foods that cause gassiness or bloating.  Typical foods include beans and other legumes, cabbage, broccoli, and dairy foods.  Every person has some sensitivity to other foods, so listen to our body and avoid those foods that trigger problems for you. o Adding fiber (Citrucel, Metamucil, psyllium, Miralax) gradually can help thicken stools by absorbing excess fluid and retrain the intestines to act more normally.  Slowly increase the dose over a few weeks.  Too much fiber too soon can backfire and cause cramping & bloating. o Probiotics (such as active yogurt, Align, etc) may help repopulate the intestines and colon with normal bacteria and calm down a sensitive digestive tract.  Most studies show it to be of mild help, though, and such products can be costly. o Medicines:   Bismuth subsalicylate (ex. Kayopectate, Pepto Bismol) every 30 minutes for up to 6 doses can help control diarrhea.  Avoid if pregnant.   Loperamide (Immodium) can slow down diarrhea.  Start with two tablets (4mg  total) first and then try one tablet every 6 hours.  Avoid if you are having fevers or severe pain.  If you are not better or start feeling worse, stop all medicines and call your doctor for advice o Call your doctor if you are getting worse or not better.  Sometimes further testing (cultures, endoscopy, X-ray studies, bloodwork, etc) may be needed to help diagnose and treat the cause of the diarrhea.  Managing Pain  Pain after surgery or related to activity is often due to strain/injury to muscle, tendon, nerves and/or incisions.  This pain is usually short-term and will improve in a few months.   Many people find it helpful to do the following things TOGETHER to help speed the process of healing and  to get back to regular activity more quickly:  1. Avoid heavy physical activity a.  no lifting greater than 20 pounds b. Do not "push through" the pain.  Listen to your body and avoid positions and maneuvers than reproduce the pain c. Walking is okay as tolerated, but go slowly and stop when getting sore.  d. Remember: If it hurts to do it, then don't do it! 2. Take Anti-inflammatory medication  a. Take with food/snack around the clock for 1-2 weeks i. This helps the muscle and nerve tissues become less irritable and calm down faster b.  Choose ONE of the following over-the-counter medications: i. Naproxen 220mg  tabs (ex. Aleve) 1-2 pills twice a day  ii. Ibuprofen 200mg  tabs (ex. Advil, Motrin) 3-4 pills with every meal and just before bedtime iii. Acetaminophen 500mg  tabs (Tylenol) 1-2 pills with every meal and just before bedtime 3. Use a Heating pad or Ice/Cold Pack a. 4-6 times a day b. May use warm bath/hottub  or showers 4. Try Gentle Massage and/or Stretching  a. at the area of pain many times a day b. stop if you feel pain - do not overdo it  Try these steps together to help you body heal faster and avoid making things get worse.  Doing just one of these things may not be enough.    If you are not getting better after two weeks or are noticing you are getting worse, contact our office for further advice; we may need to re-evaluate you & see what other things we can do to help.

## 2013-10-12 ENCOUNTER — Telehealth (INDEPENDENT_AMBULATORY_CARE_PROVIDER_SITE_OTHER): Payer: Self-pay

## 2013-10-12 DIAGNOSIS — K649 Unspecified hemorrhoids: Secondary | ICD-10-CM

## 2013-10-12 MED ORDER — HYDROCORTISONE 2.5 % RE CREA: 1.0000 "application " | TOPICAL_CREAM | Freq: Two times a day (BID) | RECTAL | Status: DC

## 2013-10-12 NOTE — Telephone Encounter (Signed)
Called pt to notify him that I e-prescribed Anusol HC 2.5% per Dr Micha[facility]Gross [facility]. The pt understands.

## 2013-10-12 NOTE — Telephone Encounter (Signed)
Okay to call in some Anusol-HC 2.5% topically 4 times a day when necessary.  Refill x1

## 2013-10-12 NOTE — Addendum Note (Signed)
Addended by: Ethlyn GallerySPILLERS, Montie Swiderski on: 10/12/2013 02:22 PM   Modules accepted: Orders

## 2013-10-12 NOTE — Telephone Encounter (Signed)
Patient states the tramadol is causing N/V , he is asking for topical or supp. Medication instead of oral medication. Please advise  [facility] [phone]

## 2013-11-16 ENCOUNTER — Telehealth: Payer: Self-pay | Admitting: Family Medicine

## 2013-11-16 ENCOUNTER — Encounter: Payer: Self-pay | Admitting: Family Medicine

## 2013-11-16 ENCOUNTER — Ambulatory Visit (INDEPENDENT_AMBULATORY_CARE_PROVIDER_SITE_OTHER): Payer: BC Managed Care – PPO | Admitting: Family Medicine

## 2013-11-16 VITALS — BP 122/82 | HR 84 | Temp 98.4°F | Ht 73.0 in | Wt 212.0 lb

## 2013-11-16 DIAGNOSIS — R220 Localized swelling, mass and lump, head: Secondary | ICD-10-CM

## 2013-11-16 DIAGNOSIS — R221 Localized swelling, mass and lump, neck: Secondary | ICD-10-CM

## 2013-11-16 DIAGNOSIS — T7840XA Allergy, unspecified, initial encounter: Secondary | ICD-10-CM

## 2013-11-16 DIAGNOSIS — J45909 Unspecified asthma, uncomplicated: Secondary | ICD-10-CM

## 2013-11-16 MED ORDER — METHYLPREDNISOLONE ACETATE 40 MG/ML IJ SUSP
80.0000 mg | Freq: Once | INTRAMUSCULAR | Status: AC
  Administered 2013-11-16: 80 mg via INTRAMUSCULAR

## 2013-11-16 MED ORDER — MOMETASONE FURO-FORMOTEROL FUM 200-5 MCG/ACT IN AERO: 2.0000 | INHALATION_SPRAY | Freq: Two times a day (BID) | RESPIRATORY_TRACT | Status: DC

## 2013-11-16 MED ORDER — FIRST-DUKES MOUTHWASH MT SUSP: 5.0000 mL | Freq: Three times a day (TID) | OROMUCOSAL | Status: DC | PRN

## 2013-11-16 NOTE — Progress Notes (Signed)
Chief Complaint  Patient presents with  . Edema    lips have been swollen since this past Friday.    Swelling started 3 days ago--started in his lower gums, then spread to lips (upper and lower), tongue.  He had some throat swelling also, but that responded to benadryl.   The gum and lip swelling was also improved with benadryl.  Last dose was yesterday.  Currently has swelling in his lips, and feels like the tip of his tongue is a little swollen (further described as discomfort in the tip of the tongue, like after eating a lot of sour food).  Tongue is very sensitive to foods now.  He initially thought it was thrush (has had before, related to inhaled steroid use), but never saw any white develop, and then the lips swelled. He hasn't used steroid inhaler in 2 weeks (due to insurance issues).  He wears dentures--denies any new creams, adhesive.  Cleans them daily, no new products.  Denies any new foods, exposures.  He ate at home all weekend, not in restaurants.  No seafood, no new spices or new foods.  Denies any new supplements, vitamins.  He has h/o allergies and asthma, but has been off medications--stopped Symbicort 2 weeks ago due to insurance issue.  He states they will pay for Advair, but used that in the past and it was not effective.  He has not noticed any exacerbation of his breathing since stopping, but this time of year is usually his worst.  Denies fevers.  He has nasal congestion, runny nose (clear).  He hasn't used Flonase in about 2 weeks ago--makes it too dry. He also stopped using his singulair. He is having some sinus pressure in his cheeks, related to his gums swelling (per pt).    Past Medical History  Diagnosis Date  . Allergy   . Asthma    Past Surgical History  Procedure Laterality Date  . Cervical fusion    . Vasectomy    . Carpal tunnel release    . Dental surgery      all teeth  . Nose surgery      broke nose / sinus cavity   History   Social History  .  Marital Status: Married    Spouse Name: N/A    Number of Children: N/A  . Years of Education: N/A   Occupational History  . [employer]loading [employer]   Social History Main Topics  . Smoking status: Former Smoker    Quit date: 09/25/1995  . Smokeless tobacco: Never Used  . Alcohol Use: No  . Drug Use: No  . Sexual Activity: Yes    Birth Control/ Protection: Surgical   Other Topics Concern  . Not on file   Social History Narrative  . No narrative on file    Outpatient Encounter Prescriptions as of 11/16/2013  Medication Sig  . GARCINIA CAMBOGIA-CHROMIUM PO Take by mouth.  Marland Kitchen OVER THE COUNTER MEDICATION   . albuterol (PROVENTIL HFA;VENTOLIN HFA) 108 (90 BASE) MCG/ACT inhaler Inhale 2 puffs into the lungs every 6 (six) hours as needed for wheezing.  . fluticasone (FLONASE) 50 MCG/ACT nasal spray Place 2 sprays into the nose daily.  . hydrocortisone (ANUSOL-HC) 2.5 % rectal cream Place 1 application rectally 2 (two) times daily. Apply around anus for irritated & painful hemorrhoids 4 times a day  . montelukast (SINGULAIR) 10 MG tablet TAKE 1 TABLET (10 MG TOTAL) BY MOUTH AT BEDTIME.  . naproxen (NAPROSYN) 500 MG tablet Take  1 tablet (500 mg total) by mouth 2 (two) times daily with a meal.  . SYMBICORT 160-4.5 MCG/ACT inhaler INHALE 2 PUFFS INTO THE LUNGS 2 (TWO) TIMES DAILY.  . VENTOLIN HFA 108 (90 BASE) MCG/ACT inhaler Inhale 2 puffs into the lungs every 6 (six) hours as needed for wheezing.  . [DISCONTINUED] traMADol (ULTRAM) 50 MG tablet Take 1-2 tablets (50-100 mg total) by mouth every 6 (six) hours as needed for moderate pain or severe pain.   He is NOT taking any medications currently, other than the OTC supplements (herbal) listed above. also takes "black seed oil"  Allergies  Allergen Reactions  . Vicodin [Hydrocodone-Acetaminophen] Anaphylaxis  . Morphine And Related Nausea And Vomiting  . Penicillins   . Tramadol Nausea And Vomiting   ROS:  Denies nausea, vomiting, diarrhea,  skin rashes, hives, bleeding, bruising.  Denies cough, shortness of breath.  Denies dizziness, chest pain.  See HPI  PHYSICAL EXAM: BP 122/82  Pulse 84  Temp(Src) 98.4 F (36.9 C) (Oral)  Ht 6\' 1"  (1.854 m)  Wt 212 lb (96.163 kg)  BMI 27.98 kg/m2  Well developed male in no distress HEENT:  Edentulous.  No significant erythema, no ulcerations, gumline (tooth surface) nontender).  Area of discomfort is along lips and buccal mucosa (normal in appearance).  There is some soft tissue swelling of upper buccal mucosa (above upper lip).  No erythema. Tongue appears normal.  Mildly tender over upper maxilla (area of some mild swelling).  Remainder of sinuses nontender.  OP is without erythema or swelling posteriorly. Nasal mucosa is mildly edematous with clear mucus.  No erythema Neck: no lymphadenopathy, thyromegaly Heart: regular rate and rhythm without murmur Lungs: clear bilaterally.  Very mild intermittent wheeze with forced expiration.  Good air movement Skin: no rash  ASSESSMENT/PLAN:  Mouth swelling - Plan: methylPREDNISolone acetate (DEPO-MEDROL) injection 80 mg  Unspecified asthma(493.90) - Plan: mometasone-formoterol (DULERA) 200-5 MCG/ACT AERO, methylPREDNISolone acetate (DEPO-MEDROL) injection 80 mg  Allergic reaction - Plan: methylPREDNISolone acetate (DEPO-MEDROL) injection 80 mg  Allergic reaction--?etiology.  Recommended stopping all supplements (and can re-introduce them one at a time, if desired). Treat with depo-medrol today.   Take zyrtec 10mg  once daily.  Can continue to use benadryl (can limit to the evenings, due to sedation) if needed for recurrent swelling.  I also recommend using pepcid or zantac twice daily until the swelling has resolved  Return if increasing swelling or new symptoms develop.  Trial of magic mouthwash (phoned in)  Asthma--insurance no longer covering symbicort.  [facility].  Sample given.  He declined savings card--states that rx  for 3 month supply won't cost him out of pocket.

## 2013-11-16 NOTE — Telephone Encounter (Signed)
Called patient back to see if there was anything I could do for him, left message on his voicemail.

## 2013-11-16 NOTE — Patient Instructions (Signed)
Allergic reaction--?etiology.  Recommended stopping all supplements (and can re-introduce them one at a time, if desired). Treated with depo-medrol injection today.    Take zyrtec (cetirizine) 10mg  once daily.  Can continue to use benadryl (can limit to the evenings, due to sedation) if needed for recurrent swelling. I also recommend using pepcid or zantac twice daily until the swelling has resolved Return if increasing swelling or new symptoms develop.  Trial of magic mouthwash (phoned in--benadryl, mylanta and carafate, swish and spit three times daily before meals, and more often, if needed)

## 2013-11-17 ENCOUNTER — Other Ambulatory Visit: Payer: Self-pay | Admitting: Medical

## 2013-11-17 ENCOUNTER — Telehealth: Payer: Self-pay | Admitting: Medical

## 2013-11-17 MED ORDER — EPINEPHRINE 0.3 MG/0.3ML IJ SOAJ: 0.3000 mg | Freq: Once | INTRAMUSCULAR | Status: DC

## 2013-11-17 NOTE — Telephone Encounter (Signed)
epipen sent.

## 2013-11-17 NOTE — Telephone Encounter (Signed)
Pt called and stated that he saw Dr. Lynelle DoctorKnapp yesterday. He states when he got home he realized his epi pen had expired. You are his regular provider. [facility] [address]

## 2013-11-17 NOTE — Telephone Encounter (Signed)
Pt informed kds

## 2014-02-01 ENCOUNTER — Other Ambulatory Visit: Payer: Self-pay | Admitting: Physician Assistant

## 2014-02-08 ENCOUNTER — Telehealth: Payer: Self-pay | Admitting: Internal Medicine

## 2014-02-08 ENCOUNTER — Other Ambulatory Visit: Payer: Self-pay | Admitting: Family Medicine

## 2014-02-08 MED ORDER — ALBUTEROL SULFATE HFA 108 (90 BASE) MCG/ACT IN AERS: 2.0000 | INHALATION_SPRAY | Freq: Four times a day (QID) | RESPIRATORY_TRACT | Status: DC | PRN

## 2014-02-08 MED ORDER — FLUTICASONE PROPIONATE 50 MCG/ACT NA SUSP: 2.0000 | Freq: Every day | NASAL | Status: DC

## 2014-02-08 NOTE — Telephone Encounter (Signed)
RX SENT FOR 90 DAYS PER dAVID TYSINGER PAC. CLS

## 2014-02-08 NOTE — Telephone Encounter (Signed)
pls send the scripts

## 2014-02-08 NOTE — Telephone Encounter (Signed)
Pt needs a 3 month supply on flonase, and albuterol inhaler. [facility] [address]

## 2014-02-23 ENCOUNTER — Encounter: Payer: Self-pay | Admitting: Medical

## 2014-02-23 ENCOUNTER — Ambulatory Visit (INDEPENDENT_AMBULATORY_CARE_PROVIDER_SITE_OTHER): Payer: BC Managed Care – PPO | Admitting: Medical

## 2014-02-23 VITALS — BP 112/80 | HR 60 | Temp 97.8°F | Resp 14 | Wt 214.0 lb

## 2014-02-23 DIAGNOSIS — M7552 Bursitis of left shoulder: Secondary | ICD-10-CM

## 2014-02-23 DIAGNOSIS — M25519 Pain in unspecified shoulder: Secondary | ICD-10-CM

## 2014-02-23 DIAGNOSIS — M5380 Other specified dorsopathies, site unspecified: Secondary | ICD-10-CM

## 2014-02-23 DIAGNOSIS — M25512 Pain in left shoulder: Secondary | ICD-10-CM

## 2014-02-23 DIAGNOSIS — M67919 Unspecified disorder of synovium and tendon, unspecified shoulder: Secondary | ICD-10-CM

## 2014-02-23 DIAGNOSIS — M719 Bursopathy, unspecified: Secondary | ICD-10-CM

## 2014-02-23 DIAGNOSIS — M62830 Muscle spasm of back: Secondary | ICD-10-CM

## 2014-02-23 NOTE — Progress Notes (Signed)
Subjective: Kristopher Gomez presents today for left shoulder pain . He reports posterior shoulder pain x4 days, works as a Furniture conservator/restorer, and in the past few days has lifted over 100 boxes at work ranging from 60-160 pounds.  Denies particular injury, trauma, or fall, but he started having shoulder pain out of the blue.   Starts in the shoulder blade in the knee to shoulder blade, radiates down into the arm and into the neck, decreased range of motion, no shoulder abduction above 90, or flexion above 90.  He does have a history of cervical disc herniation prior neck surgery, 2004.  No residual problems from this. Has used Tumaric over-the-counter but no over-the-counter NSAIDs or prescription meds.  He is right handed. His wife has been massaging his arm and says is tender over supraspinatus and subscapularis.   No other aggravating or relieving factors. No other complaint  ROS as in subjective  Objective: GEN: well-developed well-nourished, no distress Skin: No bruising no erythema no ecchymosis MSK/back: Left Shoulder with tenderness over scapular spine, tender left rhomboids, tender generalized scapular area, tender supraspinatus and trapezius, shoulder flexion abduction limited due to pain to 90 degrees with active and passive ROM due to pain, decreased external range of motion due to pain, internal range of motion normal Arm is neurovascularly intact Upper extremity no edema Neck: Supple, nontender, normal range of motion, anterior surgical scar    Assessment: Encounter Diagnoses  Name Primary?  . Shoulder pain, left Yes  . Muscle spasm of back   . Bursitis of shoulder, left     Plan: Symptoms and exam findings suggestive of muscle spasm of back, shoulder strain, back strain, possible bursitis left shoulder-advised a few days of alternating ice and heat, massage therapy (wife is a massage therapist will be doing this), Aleve over-the-counter 2 tablets twice daily, increased water  intake, 2 days given work note.  Should gradually get back to normal in the next week or so. Return if not improving or worse

## 2014-02-25 ENCOUNTER — Encounter: Payer: Self-pay | Admitting: Medical

## 2014-02-25 ENCOUNTER — Telehealth: Payer: Self-pay | Admitting: Medical

## 2014-02-25 NOTE — Telephone Encounter (Signed)
done

## 2014-02-25 NOTE — Telephone Encounter (Signed)
Patient doesn't want any medications at all. He states that [employer] doesn't do work restrictions, so he states that he would do the work note out through the weekend and he feels like he will be ready for work Monday. CLS

## 2014-02-25 NOTE — Telephone Encounter (Signed)
I recommended ice/heat alternating, massage, and he declined medication that day.    At this point, I can give him short term prescription anti-inflammatory + some low dose pain medication if agreeable.  Does he want me to write modified work restrictions with return to work this weekend?    Worse case, we can write out through this weekend only.

## 2014-02-25 NOTE — Telephone Encounter (Signed)
Ok, note through weekend out of work

## 2014-03-12 ENCOUNTER — Telehealth: Payer: Self-pay | Admitting: Internal Medicine

## 2014-03-12 MED ORDER — ALBUTEROL SULFATE HFA 108 (90 BASE) MCG/ACT IN AERS: 2.0000 | INHALATION_SPRAY | Freq: Four times a day (QID) | RESPIRATORY_TRACT | Status: DC | PRN

## 2014-03-12 NOTE — Telephone Encounter (Signed)
Per shane- call in the ventolin

## 2014-03-12 NOTE — Telephone Encounter (Signed)
Send in med for brand name--ventolin

## 2014-03-12 NOTE — Telephone Encounter (Signed)
pls check on this.  I sent albuterol inhaler, it could have been different brand name, but should work the same.   Check with pharmacist.

## 2014-03-12 NOTE — Telephone Encounter (Signed)
Pt states that he did not get ventolin sent in to his pharmacy. Something else was sent in and it was not working. [facility] [address]

## 2014-03-12 NOTE — Telephone Encounter (Signed)
Called pt back and patient states that albuterol inhaler was sent in. He has to have the brand name VENTOLIN for it to work for him. Please send in VENTOLIN(THE BRAND NAME) not generic albuterol. [facility] [address]

## 2014-04-09 ENCOUNTER — Ambulatory Visit (INDEPENDENT_AMBULATORY_CARE_PROVIDER_SITE_OTHER): Payer: BC Managed Care – PPO | Admitting: Medical

## 2014-04-09 ENCOUNTER — Encounter: Payer: Self-pay | Admitting: Medical

## 2014-04-09 VITALS — BP 122/82 | HR 80 | Temp 98.0°F | Resp 16 | Wt 210.0 lb

## 2014-04-09 DIAGNOSIS — J4541 Moderate persistent asthma with (acute) exacerbation: Secondary | ICD-10-CM

## 2014-04-09 DIAGNOSIS — J329 Chronic sinusitis, unspecified: Secondary | ICD-10-CM

## 2014-04-09 DIAGNOSIS — J40 Bronchitis, not specified as acute or chronic: Secondary | ICD-10-CM

## 2014-04-09 DIAGNOSIS — J45901 Unspecified asthma with (acute) exacerbation: Secondary | ICD-10-CM

## 2014-04-09 MED ORDER — ALBUTEROL SULFATE HFA 108 (90 BASE) MCG/ACT IN AERS: 2.0000 | INHALATION_SPRAY | Freq: Four times a day (QID) | RESPIRATORY_TRACT | Status: DC | PRN

## 2014-04-09 MED ORDER — BUDESONIDE-FORMOTEROL FUMARATE 160-4.5 MCG/ACT IN AERO: 2.0000 | INHALATION_SPRAY | Freq: Two times a day (BID) | RESPIRATORY_TRACT | Status: DC

## 2014-04-09 MED ORDER — CLARITHROMYCIN 500 MG PO TABS: 500.0000 mg | ORAL_TABLET | Freq: Two times a day (BID) | ORAL | Status: DC

## 2014-04-09 MED ORDER — METHYLPREDNISOLONE (PAK) 4 MG PO TABS: ORAL_TABLET | ORAL | Status: DC

## 2014-04-09 NOTE — Progress Notes (Signed)
   Subjective:    Patient ID: Kristopher Gomez, male    DOB: 17-Aug-1966, 48 y.o.   MRN: [id number]  HPI  Patient is presenting for follow up on his asthma. Wheezing and coughing for the last month. Cough is productive in the last week producing yellow phlegm. Also has congestion, sinus pressure, itchy watery eyes, ear pressure. Patient reports symptoms daily, waking up twice a night in the last week, using albuterol every day multiple times a day and at night. Pharmacy switched his inhaler to a different one, is not working as well, used to use a blue inhaler that "worked 10 times better". Reports that insurance coverage changed, will cover Advair but patient states it didn't work and won't cover any other ICS. Denies fevers, headaches, throat pain, difficulty swallowing, chest pain. Patient is a non-smoker, not on any allergy medicine. Used to take Singulair and Flonase but not taking for the last few months, switched to natural remedies. No other aggravating or relieving factors.  Review of Systems As in subjective.     Objective:   Physical Exam  BP 122/82  Pulse 80  Temp(Src) 98 F (36.7 C) (Oral)  Resp 16  Wt 210 lb (95.255 kg)  SpO2 97%   General appearence: alert, no distress, WD/WN HEENT: normocephalic, sclerae anicteric, conjunctiva pink and moist, TMs mildly flat, frontal and maxillary sinuses tender, nares patent but dry mucous membranes, no discharge or erythema, pharynx normal, tonsils unremarkable Oral cavity: MMM, no lesions Neck: supple, no lymphadenopathy, no thyromegaly, no masses Heart: RRR, normal S1, S2, no murmurs Lungs: diffuse wheezes throughout, rhonchi, or rales Pulses: 2+ symmetric      Assessment & Plan:   Encounter Diagnoses  Name Primary?  Marland Kitchen. Asthma with acute exacerbation, moderate persistent Yes  . Sinobronchitis    We discussed his concerns, symptoms, asthma flare, medications.   Specific recommendations today include:  Begin Symbicort one  inhalation twice daily for asthma prevention  You may use albuterol inhaler 1 puff every 6 hours as needed for wheezing and shortness of breath  Begin Biaxin antibiotic twice daily for 10 day  Begin Medrol steroid Dosepak for asthma exacerbation  If not improving in the next few days then call back  If you get to the pharmacy and there is an issue with the medication, please make sure you have the pharmacist call me back with options that will be cheaper  Patient was seen in conjunction with PA student Wallis BambergMario Mani, and I have also evaluated and examined patient, agree with student's notes, student supervised by me.

## 2014-04-09 NOTE — Patient Instructions (Signed)
  Thank you for giving me the opportunity to serve you today.    Your diagnosis today includes: Encounter Diagnoses  Name Primary?  . Asthma with acute exacerbation, moderate persistent Yes  . Sinobronchitis      Specific recommendations today include:  Begin Symbicort one inhalation twice daily for asthma prevention  You may use albuterol inhaler 1 puff every 6 hours as needed for wheezing and shortness of breath  Begin Biaxin antibiotic twice daily for 10 day  Begin Medrol steroid Dosepak for asthma exacerbation  If not improving in the next few days then call back  If you get to the pharmacy and there is an issue with the medication, please make sure you have the pharmacist call me back with options that will be cheaper  Return 86moMayIhor ARiverside Ambulatory Surgery CenteNew Zeal20monMaIhor ALexington Regional Health CenteNew Zeal98monMaIhor AColorado River Medical CenteNew Zeal1monMaIhor ARiverside Methodist HospitaNew Zeal43monMayIhor ASummersville Regional Medical CenteNew Zeal51monMayeIhor AJames J. Peters Va Medical CenteNew Zeal49mLemon CNew Zeal61moIhor AUniversity Of [facility] CentraNew Zeal47monMayePoIhor AOrange Park Medical CenteNew Zeal65monIhor ASt Francis HospitaNew Zeal99monMayIhor AOakland Mercy HospitaNew Zeal93monMIhor AFcg [facility] Endoscopy CenteNew Zeal87monMaIhor ALake Surgery And Endoscopy Center LtNew ZealIhor ALincoln Medical Cente24moiMayeSIhor ASurgical Specialty Associates LLNew Zeal70monMayeChuIhor AKaiser Fnd Hosp - South SacramentNew Zeal21monMayeHIhor ABayonet Point Surgery Center LtNew Zeal29monMayeIhor AMount St. Mary'S HospitaNew Zeal26monIhor ACampus Eye Group AsNew Zeal39monIhor AWellstar Paulding HospitaNew Zeal24monIhor AEye Surgery [facility] IncorporateNew Zeal11monMaIhor ADenver Eye Surgery CenteNew Zeal22monMayIhor AAspen Valley HospitaNew Zeal11monMayeCatalIhor AColiseum Same Day Surgery Center LNew Zeal41monMayeIhor ALanterman Developmental CenteNew Zeal3monMayeLIhor AVibra Hospital [facility] Central CampuNew Zeal24monMIhor AAll City [facility] Center InNew Zeal72monMIhor AEastern Pl[facility] CampuNew Zeal82monMayIhor AEndoscopy Center Of South SacramentNew Zeal65monMayeIIhor ADha Endoscopy LLNew Zeal60monMIhor ANexus Specialty Hospital - The WoodlandNew Zeal97mBen LomNew Zealanddi CarbonDTEXTTAG>

## 2014-05-17 ENCOUNTER — Telehealth: Payer: Self-pay | Admitting: Medical

## 2014-05-17 ENCOUNTER — Other Ambulatory Visit: Payer: Self-pay | Admitting: Medical

## 2014-05-17 MED ORDER — ALBUTEROL SULFATE HFA 108 (90 BASE) MCG/ACT IN AERS: 2.0000 | INHALATION_SPRAY | Freq: Four times a day (QID) | RESPIRATORY_TRACT | Status: DC | PRN

## 2014-05-17 MED ORDER — MOMETASONE FURO-FORMOTEROL FUM 100-5 MCG/ACT IN AERO: 2.0000 | INHALATION_SPRAY | Freq: Two times a day (BID) | RESPIRATORY_TRACT | Status: DC

## 2014-05-17 NOTE — Telephone Encounter (Signed)
Pt needs refills on his Albuterol must be Ventolin.  Says ins doesn't cover Symbicort.  I called pharmacy & ins does not cover Symbicort, covered alternatives are Advair and Dulera.   Pt can't take Advair, it doesn't work.  W[facility] to try [facility].  [facility] each

## 2014-05-17 NOTE — Telephone Encounter (Signed)
Since we are changing to different inhaler [facility]  However, if he can get both for cheaper for 90 day, then call out the same for 90 day supply.    Since asthma currently not controlled, f/u 88mo if improved, sooner if not seeing improvement.

## 2014-05-18 ENCOUNTER — Other Ambulatory Visit: Payer: Self-pay | Admitting: Family Medicine

## 2014-05-18 MED ORDER — ALBUTEROL SULFATE HFA 108 (90 BASE) MCG/ACT IN AERS: 2.0000 | INHALATION_SPRAY | Freq: Four times a day (QID) | RESPIRATORY_TRACT | Status: DC | PRN

## 2014-05-18 MED ORDER — MOMETASONE FURO-FORMOTEROL FUM 100-5 MCG/ACT IN AERO: 2.0000 | INHALATION_SPRAY | Freq: Two times a day (BID) | RESPIRATORY_TRACT | Status: DC

## 2014-05-18 NOTE — Telephone Encounter (Signed)
I spoke with the patient and it would be cheaper to send in a 90 day supply so i resent the Rx for 90 day supply. CLS

## 2014-07-11 ENCOUNTER — Other Ambulatory Visit: Payer: Self-pay | Admitting: Medical

## 2014-08-02 ENCOUNTER — Ambulatory Visit (INDEPENDENT_AMBULATORY_CARE_PROVIDER_SITE_OTHER): Payer: BC Managed Care – PPO | Admitting: Medical

## 2014-08-02 ENCOUNTER — Telehealth: Payer: Self-pay | Admitting: Medical

## 2014-08-02 ENCOUNTER — Other Ambulatory Visit: Payer: Self-pay | Admitting: Family Medicine

## 2014-08-02 ENCOUNTER — Encounter: Payer: Self-pay | Admitting: Medical

## 2014-08-02 VITALS — BP 92/60 | HR 68 | Temp 98.0°F | Wt 226.0 lb

## 2014-08-02 DIAGNOSIS — J4540 Moderate persistent asthma, uncomplicated: Secondary | ICD-10-CM

## 2014-08-02 DIAGNOSIS — M25562 Pain in left knee: Secondary | ICD-10-CM

## 2014-08-02 DIAGNOSIS — M7652 Patellar tendinitis, left knee: Secondary | ICD-10-CM

## 2014-08-02 MED ORDER — ALBUTEROL SULFATE HFA 108 (90 BASE) MCG/ACT IN AERS: 2.0000 | INHALATION_SPRAY | Freq: Four times a day (QID) | RESPIRATORY_TRACT | Status: DC | PRN

## 2014-08-02 MED ORDER — MOMETASONE FURO-FORMOTEROL FUM 100-5 MCG/ACT IN AERO: 2.0000 | INHALATION_SPRAY | Freq: Two times a day (BID) | RESPIRATORY_TRACT | Status: DC

## 2014-08-02 NOTE — Telephone Encounter (Signed)
He will call back with info on where his wife works ( she is a Adult nursephysical therapist).   Refer to PT for iontophoresis and eval and management of left knee pain.

## 2014-08-02 NOTE — Telephone Encounter (Signed)
I fax over his information to [facility] and they will contact him for his appointment

## 2014-08-02 NOTE — Progress Notes (Signed)
Subjective: He reports left knee pain.  He reports left knee pain for a year, worse in the last month.  Has pain going up and down stairs, weightbearing, feels like when going downstairs he will hyperextend. His use tumeric herb for inflammation.  No swelling, no pain in other joints, no redness warmth.   Denies fall, injury, trauma.   Right knee is fine.   Works at [employer], up and down ladder/cage, lateral motion, on treadmills at work.  Also up and down stairs at home taking care of his pets.  No other aggravating or relieving factors. No other complete.  Here for refills on asthma medication.  Taking Dulera for prevention, rescue inhaler as needed, few times since we last saw him here.  Nonsmoker.  Doing fine on current medication.    ROS as in subjective   Objective: Filed Vitals:   08/02/14 1331  BP: 92/60  Pulse: 68  Temp: 98 F (36.7 C)    General appearance: alert, no distress, WD/WN,  HEENT: normocephalic, sclerae anicteric, TMs pearly, nares patent, no discharge or erythema, pharynx normal Oral cavity: MMM, no lesions Neck: supple, no lymphadenopathy, no thyromegaly, no masses Heart: RRR, normal S1, S2, no murmurs Lungs: CTA bilaterally, no wheezes, rhonchi, or rales Pulses: 2+ symmetric, upper and lower extremities, normal cap refill MSK: left knee with tenderness at lateral and medial joint line, tender over patellar tendon and insertion site, mild swelling at the tibial tuberosity, no laxity,wise ankle hip joint nontender, normal range motion, otherwise rest of bilat LE exam unremarkable Ext: no edema    Assessment: Encounter Diagnoses  Name Primary?  . Moderate persistent asthma, uncomplicated Yes  . Knee pain, acute, left   . Patellar tendinitis, left   . Medial knee pain, left     Plan: Asthma - controlled on current medicatoin, Dulera for prevention, albuterol HFA rescue inhaler,   Knee pain - tendinitis and inflammation, likely due to his mechanics at work.   Works at [employer] up and down the "cage", constantly moving up, down, laterally and on moving treadmills.  Advised ice, elevation, relative rest, knee sleeve, NSAID prn, and referral to physical therapy.    F/u 3-4wk, sooner prn.

## 2014-08-02 NOTE — Telephone Encounter (Signed)
[facility];  Electronic refill didn't go through properly.

## 2014-08-03 ENCOUNTER — Other Ambulatory Visit: Payer: Self-pay | Admitting: Medical

## 2014-08-30 ENCOUNTER — Telehealth: Payer: Self-pay | Admitting: Internal Medicine

## 2014-08-30 NOTE — Telephone Encounter (Signed)
If we don't have baseline xrays, we need to start there.   Please set him up for xray of the knee and shoulder that is continuing to give him pain, get copy of PT notes, and then get him back for recheck examination.

## 2014-08-30 NOTE — Telephone Encounter (Signed)
Wife called for pt stating that [facility] has [facility] and is still having trouble with his knee pain. PT told pt that he would recommend getting MRI done and that they were suppose to be sending the notes over. Pt also is wanting an MRI done for his shoulder since he is still having pain too with that. Pt can only go to [facility] due to his insurance

## 2014-08-31 ENCOUNTER — Other Ambulatory Visit: Payer: Self-pay | Admitting: Family Medicine

## 2014-08-31 DIAGNOSIS — M25512 Pain in left shoulder: Secondary | ICD-10-CM

## 2014-08-31 DIAGNOSIS — M25562 Pain in left knee: Secondary | ICD-10-CM

## 2014-08-31 NOTE — Telephone Encounter (Signed)
Wife was notified of Kristopher Gomez's recommendations, however she does not know where a [facility] is (so we will probably have to call insurance company to find out), and we have not received any PT notes, please get this.

## 2014-08-31 NOTE — Telephone Encounter (Signed)
[facility] said that they only cover CT, MRI, and etc. The rep. Said that they don't cover x-rays or ultrasounds and he can go any where he wants to it will go towards his deductible.

## 2014-08-31 NOTE — Telephone Encounter (Signed)
I called the wife and I told her that I spoke with the people at [facility] and that they pulled him up in the system and she said that Xrays and ultrasound can go anywhere it will be applied to the deductible. Patient states that they don't have an deductible. I told her that this is what [facility] told explain to me. I told her that we placed orders in the system through [facility] if he wants to go there for the xray's. The wife states that she will call and see what is going on.  I called [facility] # [phone]

## 2014-08-31 NOTE — Telephone Encounter (Signed)
I called over to [facility] [phone] and they will fax over OV notes.

## 2014-09-01 ENCOUNTER — Ambulatory Visit
Admission: RE | Admit: 2014-09-01 | Discharge: 2014-09-01 | Disposition: A | Discharge status: Home / Self Care | Payer: BC Managed Care – PPO | Source: Ambulatory Visit | Attending: Medical | Admitting: Medical

## 2014-09-01 DIAGNOSIS — M25512 Pain in left shoulder: Secondary | ICD-10-CM

## 2014-09-01 DIAGNOSIS — M25562 Pain in left knee: Secondary | ICD-10-CM

## 2014-09-02 NOTE — Progress Notes (Signed)
LM to [facility] 

## 2014-09-24 HISTORY — PX: KNEE ARTHROSCOPY: SHX127

## 2014-09-24 HISTORY — PX: SHOULDER SURGERY: SHX246

## 2014-10-02 ENCOUNTER — Other Ambulatory Visit: Payer: Self-pay | Admitting: Medical

## 2014-10-04 ENCOUNTER — Telehealth: Payer: Self-pay | Admitting: Medical

## 2014-10-04 NOTE — Telephone Encounter (Signed)
[facility]  I have seen him for asthma primarily.  Please scheduled a surgical physical clearance/and or routine physical visit, fasting for labs, etc.

## 2014-10-04 NOTE — Telephone Encounter (Signed)
Pt's wife dropped off a clearance from to be completed. I am sending back form from [employer]. Please call [phone] when ready.

## 2014-10-05 NOTE — Telephone Encounter (Signed)
Patient is aware and he made an appointment for his physical

## 2014-10-07 ENCOUNTER — Ambulatory Visit (INDEPENDENT_AMBULATORY_CARE_PROVIDER_SITE_OTHER): Payer: BLUE CROSS/BLUE SHIELD | Admitting: Medical

## 2014-10-07 ENCOUNTER — Telehealth: Payer: Self-pay | Admitting: Medical

## 2014-10-07 ENCOUNTER — Ambulatory Visit
Admission: RE | Admit: 2014-10-07 | Discharge: 2014-10-07 | Disposition: A | Discharge status: Home / Self Care | Payer: BLUE CROSS/BLUE SHIELD | Source: Ambulatory Visit | Attending: Medical | Admitting: Medical

## 2014-10-07 ENCOUNTER — Encounter: Payer: Self-pay | Admitting: Medical

## 2014-10-07 VITALS — BP 132/88 | HR 72 | Temp 98.0°F | Resp 15 | Ht 73.0 in | Wt 224.0 lb

## 2014-10-07 DIAGNOSIS — J4540 Moderate persistent asthma, uncomplicated: Secondary | ICD-10-CM

## 2014-10-07 DIAGNOSIS — Z0000 Encounter for general adult medical examination without abnormal findings: Secondary | ICD-10-CM

## 2014-10-07 DIAGNOSIS — Z87891 Personal history of nicotine dependence: Secondary | ICD-10-CM

## 2014-10-07 DIAGNOSIS — S43432A Superior glenoid labrum lesion of left shoulder, initial encounter: Secondary | ICD-10-CM

## 2014-10-07 DIAGNOSIS — S83207A Unspecified tear of unspecified meniscus, current injury, left knee, initial encounter: Secondary | ICD-10-CM

## 2014-10-07 DIAGNOSIS — Z01818 Encounter for other preprocedural examination: Secondary | ICD-10-CM

## 2014-10-07 LAB — COMPREHENSIVE METABOLIC PANEL
ALBUMIN: 4.3 g/dL (ref 3.5–5.2)
ALK PHOS: 63 U/L (ref 39–117)
ALT: 34 U/L (ref 0–53)
AST: 24 U/L (ref 0–37)
BUN: 13 mg/dL (ref 6–23)
CALCIUM: 9.4 mg/dL (ref 8.4–10.5)
CO2: 28 meq/L (ref 19–32)
Chloride: 101 mEq/L (ref 96–112)
Creat: 0.74 mg/dL (ref 0.50–1.35)
Glucose, Bld: 70 mg/dL (ref 70–99)
POTASSIUM: 4.7 meq/L (ref 3.5–5.3)
Sodium: 139 mEq/L (ref 135–145)
Total Bilirubin: 0.5 mg/dL (ref 0.2–1.2)
Total Protein: 6.9 g/dL (ref 6.0–8.3)

## 2014-10-07 LAB — POCT URINALYSIS DIPSTICK
Bilirubin, UA: NEGATIVE
Blood, UA: NEGATIVE
GLUCOSE UA: NEGATIVE
Ketones, UA: NEGATIVE
Leukocytes, UA: NEGATIVE
Nitrite, UA: NEGATIVE
PROTEIN UA: NEGATIVE
Spec Grav, UA: 1.015
Urobilinogen, UA: 1
pH, UA: 6

## 2014-10-07 LAB — CBC WITH DIFFERENTIAL/PLATELET
BASOS ABS: 0.1 10*3/uL (ref 0.0–0.1)
Basophils Relative: 1 % (ref 0–1)
Eosinophils Absolute: 0.7 10*3/uL (ref 0.0–0.7)
Eosinophils Relative: 6 % — ABNORMAL HIGH (ref 0–5)
HEMATOCRIT: 48.2 % (ref 39.0–52.0)
Hemoglobin: 16 g/dL (ref 13.0–17.0)
Lymphocytes Relative: 18 % (ref 12–46)
Lymphs Abs: 2.1 10*3/uL (ref 0.7–4.0)
MCH: 27.6 pg (ref 26.0–34.0)
MCHC: 33.2 g/dL (ref 30.0–36.0)
MCV: 83.2 fL (ref 78.0–100.0)
MONO ABS: 0.9 10*3/uL (ref 0.1–1.0)
MONOS PCT: 8 % (ref 3–12)
MPV: 10.5 fL (ref 8.6–12.4)
Neutro Abs: 7.8 10*3/uL — ABNORMAL HIGH (ref 1.7–7.7)
Neutrophils Relative %: 67 % (ref 43–77)
Platelets: 245 10*3/uL (ref 150–400)
RBC: 5.79 MIL/uL (ref 4.22–5.81)
RDW: 13.9 % (ref 11.5–15.5)
WBC: 11.7 10*3/uL — AB (ref 4.0–10.5)

## 2014-10-07 LAB — LIPID PANEL
CHOL/HDL RATIO: 2.9 ratio
CHOLESTEROL: 155 mg/dL (ref 0–200)
HDL: 53 mg/dL (ref >39)
LDL Cholesterol: 87 mg/dL (ref 0–99)
Triglycerides: 75 mg/dL (ref <150)
VLDL: 15 mg/dL (ref 0–40)

## 2014-10-07 LAB — TSH: TSH: 1.792 u[IU]/mL (ref 0.350–4.500)

## 2014-10-07 NOTE — Telephone Encounter (Signed)
Refer to asthma clinic for uncontrolled asthma, consideration of Xolair

## 2014-10-07 NOTE — Progress Notes (Signed)
Subjective:   HPI  Kristopher Gomez is a 49 y.o. male who presents for a physical/surgical clearance.     Having surgery for torn medial meniscus left knee.  Surgery is pending our clearance.  [facility], Dr. Jene EveryJeffrey Beane.  Has torn labrum of left shoulder, had MRI 08/25/14.   Needs referral for PT.    Medical care team includes: Ernst BreachYSINGER, DAVID SHANE, PA-C Dr. Jene EveryJeffrey Beane, [facility]   Preventative care: Last ophthalmology visit:2015 [place] Last dental visit:2015 no longer has one Last colonoscopy:years ago Last prostate exam: 2013 Last NFA:OZHYQMEKG:within last 3 years  Last labs:3 years ago  Prior vaccinations: TD or Tdap:current Influenza:denies Pneumococcal:denies Shingles/Zostavax:denies  Advanced directive: no Health care power of attorney:no Living will:no  Concerns: Asthma - last PFT 2.5 years ago.  On Dulera 2 puffs BID now, was on singular in the past.   Uses Albuterol 2 x per week.   Prior includes symbicort, advair, others.    Reviewed their medical, surgical, family, social, medication, and allergy history and updated chart as appropriate.  Past Medical History  Diagnosis Date  . Allergy   . Asthma   . Former smoker     quit 1994, prior was 20pack year+ smoker    Past Surgical History  Procedure Laterality Date  . Cervical fusion    . Vasectomy    . Carpal tunnel release    . Dental surgery      all teeth  . Nose surgery      broke nose / sinus cavity  . Tonsillectomy    . Colonoscopy  1994    rectal bleeding    History   Social History  . Marital Status: Married    Spouse Name: N/A    Number of Children: N/A  . Years of Education: N/A   Occupational History  . [employer]   Social History Main Topics  . Smoking status: Former Smoker -- 1.00 packs/day for 20 years    Quit date: 09/25/1995  . Smokeless tobacco: Never Used  . Alcohol Use: No  . Drug Use: No  . Sexual Activity: Yes    Birth Control/ Protection:  Surgical   Other Topics Concern  . Not on file   Social History Narrative   Married, works at [employer], walking, was doing a lot before shoulder and knee issues as of 09/2014.  8 children, grandchildren    Family History  Problem Relation Age of Onset  . Asthma Father   . Asthma Brother   . Cancer Daughter     ALL LEUKEMIA  . Cancer Maternal Aunt     breast  . Cancer Maternal Aunt     breast  . Cancer Maternal Aunt     breast  . Heart disease Mother [id number]    Afib  . Alcohol abuse Mother   . Diabetes Maternal Uncle   . Stroke Neg Hx   . Asthma Brother      Current outpatient prescriptions:  .  albuterol (PROVENTIL HFA;VENTOLIN HFA) 108 (90 BASE) MCG/ACT inhaler, Inhale 2 puffs into the lungs every 6 (six) hours as needed for wheezing. PATIENT NEEDS A 90 DAY SUPPLY, Disp: 18 g, Rfl: 0 .  DULERA 100-5 MCG/ACT AERO, INHALE 2 PUFFS INTO THE LUNGS 2 (TWO) TIMES DAILY., Disp: 1 Inhaler, Rfl: 0 .  EPINEPHrine (EPIPEN) 0.3 mg/0.3 mL SOAJ injection, Inject 0.3 mLs (0.3 mg total) into the muscle once., Disp: 1 Device, Rfl: 0 .  fexofenadine-pseudoephedrine (ALLEGRA-D 24) 180-240  MG per 24 hr tablet, Take 1 tablet by mouth daily., Disp: , Rfl:  .  mometasone-formoterol (DULERA) 100-5 MCG/ACT AERO, Inhale 2 puffs into the lungs 2 (two) times daily., Disp: 3 Inhaler, Rfl: 1 .  OVER THE COUNTER MEDICATION, , Disp: , Rfl:  .  PROAIR HFA 108 (90 BASE) MCG/ACT inhaler, INHALE 2 PUFFS INTO THE LUNGS EVERY 6 HOURS AS NEEDED FOR WHEEZING OR SHORTNES OF BREATH, Disp: 25.5 each, Rfl: 0  Allergies  Allergen Reactions  . Vicodin [Hydrocodone-Acetaminophen] Anaphylaxis  . Advair Diskus [Fluticasone-Salmeterol]     Can take Symbicort  . Morphine And Related Nausea And Vomiting  . Penicillins   . Tramadol Nausea And Vomiting    Review of Systems Constitutional: -fever, -chills, -sweats, -unexpected weight change, -decreased appetite, -fatigue Allergy: +sneezing, +itching, +congestion Dermatology:  -changing moles, --rash, -lumps ENT: -runny nose, -ear pain, -sore throat, -hoarseness, -sinus pain, -teeth pain, - ringing in ears, -hearing loss, -nosebleeds Cardiology: -chest pain, -palpitations, -swelling, -difficulty breathing when lying flat, +waking up short of breath Respiratory: -cough, +shortness of breath, -difficulty breathing with exercise or exertion, +wheezing, -coughing up blood Gastroenterology: -abdominal pain, -nausea, -vomiting, -diarrhea, -constipation, -blood in stool, -changes in bowel movement, -difficulty swallowing or eating Hematology: -bleeding, -bruising  Musculoskeletal: +joint aches, -muscle aches,+joint swelling, -back pain, +neck pain, -cramping, -changes in gait Ophthalmology: denies vision changes, eye redness, itching, discharge Urology: -burning with urination, -difficulty urinating, -blood in urine, -urinary frequency, -urgency, -incontinence Neurology: -headache, -weakness, -tingling, -numbness, -memory loss, -falls, -dizziness Psychology: -depressed mood, -agitation, -sleep problems     Objective:   Physical Exam  BP 132/88 mmHg  Pulse 72  Temp(Src) 98 F (36.7 C) (Oral)  Resp 15  Ht  (1.854 m)  Wt 224 lb (101.606 kg)  BMI 29.56 kg/m2  General appearance: alert, no distress, WD/WN, white male Skin: scattered benign appearing macules HEENT: normocephalic, conjunctiva/corneas normal, sclerae anicteric, PERRLA, EOMi, nares patent, no discharge or erythema, pharynx normal Oral cavity: MMM, tongue normal, teeth - upper and lower dentures Neck: supple, no lymphadenopathy, no thyromegaly, no masses, normal ROM, no bruits Chest: non tender, normal shape and expansion Heart: RRR, normal S1, S2, no murmurs Lungs: few scattered faint wheezes, no rhonchi, or rales Abdomen: +bs, soft, non tender, non distended, no masses, no hepatomegaly, no splenomegaly, no bruits Back: non tender, normal ROM, no scoliosis Musculoskeletal: did not examine left  shoulder and left knee due to pain, otherwise upper extremities non tender, no obvious deformity, normal ROM throughout, lower extremities non tender, no obvious deformity, normal ROM throughout Extremities: no edema, no cyanosis, no clubbing Pulses: 2+ symmetric, upper and lower extremities, normal cap refill Neurological: alert, oriented x 3, CN2-12 intact, strength normal upper extremities and lower extremities, sensation normal throughout, DTRs 2+ throughout, no cerebellar signs, gait normal Psychiatric: normal affect, behavior normal, pleasant  GU: normal male external genitalia, circumcised, nontender, no masses, no hernia, no lymphadenopathy Rectal: deferred   Adult ECG Report  Indication: surgery clearance  Rate: 65 bpm  Rhythm: normal sinus rhythm  QRS Axis: 81 degrees  PR Interval:  QRS Duration:  QTc:  Conduction Disturbances: none  Other Abnormalities: none  Patient's cardiac risk factors are: none.  EKG comparison: none  Narrative Interpretation: normal EKG    Assessment and Plan :    Encounter Diagnoses  Name Primary?  . Preoperative clearance Yes  . Routine general medical examination at a health care facility   . Former smoker   .  Asthma, chronic, moderate persistent, uncomplicated   . Meniscus tear, left, initial encounter   . Glenoid labrum tear, left, initial encounter    Physical exam - discussed healthy lifestyle, diet, exercise, preventative care, vaccinations, and addressed their concerns.   Former smoker Asthma - doing ok on Dulera and Albuterol, taking Allegra D.  Still not as well controlled as prior, but insurance has been limited in coverage.  May benefit from Xolair.   Referral for PFTs, CXR, and asthma specialist referral Mensicus tear - surgery pending PFTs and CXR Labrum tear - referral to PT Follow-up pending studies.

## 2014-10-07 NOTE — Telephone Encounter (Signed)
Please schedule baseline spirometry/PFTs, not full PFTs  He needs to go for chest x-ray  Refer to physical therapy but call him about this

## 2014-10-08 NOTE — Telephone Encounter (Signed)
Kristopher Gomez told me that pt is checking into this and will call us back with info.

## 2014-10-12 ENCOUNTER — Other Ambulatory Visit: Payer: Self-pay | Admitting: Medical

## 2014-10-12 ENCOUNTER — Encounter: Payer: Self-pay | Admitting: Pulmonary Disease

## 2014-10-12 ENCOUNTER — Ambulatory Visit (INDEPENDENT_AMBULATORY_CARE_PROVIDER_SITE_OTHER): Payer: BLUE CROSS/BLUE SHIELD | Admitting: Pulmonary Disease

## 2014-10-12 VITALS — BP 122/74 | HR 103 | Temp 97.4°F | Ht 73.0 in | Wt 231.8 lb

## 2014-10-12 DIAGNOSIS — J4540 Moderate persistent asthma, uncomplicated: Secondary | ICD-10-CM | POA: Insufficient documentation

## 2014-10-12 DIAGNOSIS — J45909 Unspecified asthma, uncomplicated: Secondary | ICD-10-CM

## 2014-10-12 MED ORDER — EPINEPHRINE 0.3 MG/0.3ML IJ SOAJ: 0.3000 mg | Freq: Once | INTRAMUSCULAR | Status: DC

## 2014-10-12 NOTE — Telephone Encounter (Signed)
Patient already had his appointment with asthma and allergy. Patient has his appointment with pulmonology next week I fax over his referral to [facility] for therapy on his shoulder and they will contact him for his appointment

## 2014-10-12 NOTE — Telephone Encounter (Signed)
Patient has his appointment and he is aware of the appointment and [facility]

## 2014-10-12 NOTE — Progress Notes (Signed)
   Subjective:    Patient ID: Kristopher Gomez, male    DOB: 02-21-1966, 49 y.o.   MRN: [id number]  HPI The patient is a 49 year old male who I've been asked to see for evaluation of asthma. The patient tells me that he was diagnosed with asthma as a child, and has been on medication since that time. He feels that it is been fairly well-controlled, and is had no significant increase in symptoms or frequent exacerbations. His last severe flare was approximately 20 years ago that resulted in a hospitalization, but he does have increasing asthma symptoms when he develops a "cold". He usually responds very well to prednisone when this occurs, and he took 2 doses last year. He denies any significant cough, congestion, or rattling in the chest currently. He is very satisfied with his exercise tolerance. He has had recent allergy testing, and apparently there were many positive results. However, the decision was made to treat this conservatively with medication rather than allergy vaccine. The patient has been on Singulair in the distant past and really did not feel it helped him. He also has a history of smoking for 20 years, but has not done so since 1997. He has had a recent chest x-ray that was really unremarkable, but has not had spirometry in almost 3 years. He is supposed to have upcoming knee and shoulder surgery, however was required to get pulmonary clearance before proceeding with this. He currently is taking dulera twice a day, but feels that Advair worked much better for him.   Review of Systems  Constitutional: Negative for fever and unexpected weight change.  HENT: Positive for congestion. Negative for dental problem, ear pain, nosebleeds, postnasal drip, rhinorrhea, sinus pressure, sneezing, sore throat and trouble swallowing.   Eyes: Negative for redness and itching.  Respiratory: Negative for cough, chest tightness, shortness of breath and wheezing.   Cardiovascular: Negative for palpitations and  leg swelling.  Gastrointestinal: Negative for nausea and vomiting.  Genitourinary: Negative for dysuria.  Musculoskeletal: Negative for joint swelling.  Skin: Negative for rash.  Neurological: Negative for headaches.  Hematological: Does not bruise/bleed easily.  Psychiatric/Behavioral: Negative for dysphoric mood. The patient is not nervous/anxious.        Objective:   Physical Exam Constitutional:  Well developed, no acute distress  HENT:  Nares patent without discharge, mild crusting and deviated septum  Oropharynx without exudate, palate and uvula are normal  Eyes:  Perrla, eomi, no scleral icterus  Neck:  No JVD, no TMG  Cardiovascular:  Normal rate, regular rhythm, no rubs or gallops.  No murmurs        Intact distal pulses  Pulmonary :  Normal breath sounds, no stridor or respiratory distress   No rales, rhonchi, a few wheezes  Abdominal:  Soft, nondistended, bowel sounds present.  No tenderness noted.   Musculoskeletal:  No lower extremity edema noted.  Lymph Nodes:  No cervical lymphadenopathy noted  Skin:  No cyanosis noted  Neurologic:  Alert, appropriate, moves all 4 extremities without obvious deficit.         Assessment & Plan:

## 2014-10-12 NOTE — Assessment & Plan Note (Signed)
The patient has a long-standing history of asthma dating back to childhood, but has been reasonably controlled on maintenance bronchodilator therapy. The patient currently feels that he is at a reasonable baseline, and does not overuse his rescue inhaler. He does feel that Advair helps his symptoms more so than dulera, but this is not covered on his insurance plan. He has had a recent chest x-ray that is really fairly clear, and his spirometry today does not show any definite airflow obstruction. At some mild air-trapping. Overall, the patient feels that he is at a fairly get baseline currently, and I see no reason why he cannot have the planned orthopedic surgery. He does not require continued follow-up, but I'm happy to see him again if he develops poor control of his asthma symptoms. More than likely this will be related to allergy symptoms.

## 2014-10-12 NOTE — Telephone Encounter (Signed)
Patient already had his chest xray

## 2014-10-12 NOTE — Patient Instructions (Signed)
Continue on your current asthma medication twice a day. You may need more aggressive treatment of your allergies if you have increased asthma symptoms.  I will be happy to see you again if you have significant asthma issues.  I see no reason why you cannot have your orthopedic surgery.

## 2014-10-13 NOTE — Progress Notes (Signed)
pls get me the surgery clearance form, should be in chart.

## 2014-10-16 ENCOUNTER — Other Ambulatory Visit: Payer: Self-pay | Admitting: Medical

## 2015-01-27 ENCOUNTER — Other Ambulatory Visit: Payer: Self-pay | Admitting: Medical

## 2015-05-13 ENCOUNTER — Other Ambulatory Visit: Payer: Self-pay | Admitting: Medical

## 2015-05-13 NOTE — Telephone Encounter (Signed)
shane is this okay to refill

## 2015-06-10 ENCOUNTER — Other Ambulatory Visit: Payer: Self-pay | Admitting: Medical

## 2015-06-13 ENCOUNTER — Other Ambulatory Visit: Payer: Self-pay | Admitting: Medical

## 2015-06-13 ENCOUNTER — Telehealth: Payer: Self-pay | Admitting: Medical

## 2015-06-13 ENCOUNTER — Other Ambulatory Visit: Payer: Self-pay

## 2015-06-13 MED ORDER — FLUTICASONE PROPIONATE 50 MCG/ACT NA SUSP: 2.0000 | Freq: Every day | NASAL | Status: DC

## 2015-06-13 NOTE — Telephone Encounter (Signed)
shane this is up to you if you want him to have a 90 day supply

## 2015-06-13 NOTE — Telephone Encounter (Signed)
Pt states he needs Flonase prescription sent into [facility] [address] but for 90 days not 30

## 2015-06-16 ENCOUNTER — Other Ambulatory Visit: Payer: Self-pay | Admitting: Medical

## 2015-10-16 ENCOUNTER — Emergency Department (HOSPITAL_COMMUNITY): Payer: BLUE CROSS/BLUE SHIELD

## 2015-10-16 ENCOUNTER — Emergency Department (HOSPITAL_COMMUNITY)
Admission: EM | Admit: 2015-10-16 | Discharge: 2015-10-16 | Disposition: A | Discharge status: Home / Self Care | Payer: BLUE CROSS/BLUE SHIELD | Attending: Emergency Medicine | Admitting: Emergency Medicine

## 2015-10-16 ENCOUNTER — Encounter (HOSPITAL_COMMUNITY): Payer: Self-pay | Admitting: *Deleted

## 2015-10-16 DIAGNOSIS — Z87891 Personal history of nicotine dependence: Secondary | ICD-10-CM | POA: Insufficient documentation

## 2015-10-16 DIAGNOSIS — Z7951 Long term (current) use of inhaled steroids: Secondary | ICD-10-CM | POA: Diagnosis not present

## 2015-10-16 DIAGNOSIS — Z79899 Other long term (current) drug therapy: Secondary | ICD-10-CM | POA: Diagnosis not present

## 2015-10-16 DIAGNOSIS — Y998 Other external cause status: Secondary | ICD-10-CM | POA: Diagnosis not present

## 2015-10-16 DIAGNOSIS — Y9289 Other specified places as the place of occurrence of the external cause: Secondary | ICD-10-CM | POA: Insufficient documentation

## 2015-10-16 DIAGNOSIS — J45909 Unspecified asthma, uncomplicated: Secondary | ICD-10-CM | POA: Insufficient documentation

## 2015-10-16 DIAGNOSIS — Z9889 Other specified postprocedural states: Secondary | ICD-10-CM | POA: Diagnosis not present

## 2015-10-16 DIAGNOSIS — M5417 Radiculopathy, lumbosacral region: Secondary | ICD-10-CM | POA: Diagnosis not present

## 2015-10-16 DIAGNOSIS — Z880 Allergy status to penicillin: Secondary | ICD-10-CM | POA: Diagnosis not present

## 2015-10-16 DIAGNOSIS — Y9389 Activity, other specified: Secondary | ICD-10-CM | POA: Insufficient documentation

## 2015-10-16 DIAGNOSIS — X58XXXA Exposure to other specified factors, initial encounter: Secondary | ICD-10-CM | POA: Insufficient documentation

## 2015-10-16 DIAGNOSIS — S3992XA Unspecified injury of lower back, initial encounter: Secondary | ICD-10-CM | POA: Diagnosis not present

## 2015-10-16 MED ORDER — OXYCODONE-ACETAMINOPHEN 5-325 MG PO TABS: 1.0000 | ORAL_TABLET | ORAL | Status: DC | PRN

## 2015-10-16 MED ORDER — OXYCODONE-ACETAMINOPHEN 5-325 MG PO TABS
1.0000 | ORAL_TABLET | Freq: Once | ORAL | Status: AC
  Administered 2015-10-16: 1 via ORAL

## 2015-10-16 MED ORDER — DEXAMETHASONE SODIUM PHOSPHATE 10 MG/ML IJ SOLN
10.0000 mg | Freq: Once | INTRAMUSCULAR | Status: AC
  Administered 2015-10-16: 10 mg via INTRAMUSCULAR
  Filled 2015-10-16: qty 1

## 2015-10-16 MED ORDER — DIAZEPAM 5 MG PO TABS
5.0000 mg | ORAL_TABLET | Freq: Once | ORAL | Status: AC
  Administered 2015-10-16: 5 mg via ORAL
  Filled 2015-10-16: qty 1

## 2015-10-16 MED ORDER — DIAZEPAM 5 MG PO TABS: 5.0000 mg | ORAL_TABLET | Freq: Three times a day (TID) | ORAL | Status: DC | PRN

## 2015-10-16 MED ORDER — NAPROXEN 500 MG PO TABS: 500.0000 mg | ORAL_TABLET | Freq: Two times a day (BID) | ORAL | Status: DC

## 2015-10-16 MED ORDER — OXYCODONE-ACETAMINOPHEN 5-325 MG PO TABS
ORAL_TABLET | ORAL | Status: AC
  Filled 2015-10-16: qty 1

## 2015-10-16 MED ORDER — HYDROMORPHONE HCL 1 MG/ML IJ SOLN
1.0000 mg | Freq: Once | INTRAMUSCULAR | Status: AC
  Administered 2015-10-16: 1 mg via INTRAMUSCULAR
  Filled 2015-10-16: qty 1

## 2015-10-16 NOTE — ED Notes (Signed)
Patient states he can take Percocet

## 2015-10-16 NOTE — ED Notes (Signed)
Patient transported to X-ray 
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
## 2015-10-16 NOTE — ED Notes (Signed)
Patient presents stating he started with lower back pain yesterday.  Patient walking straight up and has trouble sitting.  History of surgery

## 2015-10-16 NOTE — Discharge Instructions (Signed)
Naprosyn for pain. Percocet for severe pain. Valium for spasms. Rest. Follow up with primary care doctor or neurosurgery for further evaluation and treatment. Return if worsening symptoms.   Lumbosacral Radiculopathy Lumbosacral radiculopathy is a condition that involves the spinal nerves and nerve roots in the low back and bottom of the spine. The condition develops when these nerves and nerve roots move out of place or become inflamed and cause symptoms. CAUSES This condition may be caused by:  Pressure from a disk that bulges out of place (herniated disk). A disk is a plate of cartilage that separates bones in the spine.  Disk degeneration.  A narrowing of the bones of the lower back (spinal stenosis).  A tumor.  An infection.  An injury that places sudden pressure on the disks that cushion the bones of your lower spine. RISK FACTORS This condition is more likely to develop in:  Males aged 30-50 years.  Females aged 50-60 years.  People who lift improperly.  People who are overweight or live a sedentary lifestyle.  People who smoke.  People who perform repetitive activities that strain the spine. SYMPTOMS Symptoms of this condition include:  Pain that goes down from the back into the legs (sciatica). This is the most common symptom. The pain may be worse with sitting, coughing, or sneezing.  Pain and numbness in the arms and legs.  Muscle weakness.  Tingling.  Loss of bladder control or bowel control. DIAGNOSIS This condition is diagnosed with a physical exam and medical history. If the pain is lasting, you may have tests, such as:  MRI scan.  X-ray.  CT scan.  Myelogram.  Nerve conduction study. TREATMENT This condition is often treated with:  Hot packs and ice applied to affected areas.  Stretches to improve flexibility.  Exercises to strengthen back muscles.  Physical therapy.  Pain medicine.  A steroid injection in the spine. In some cases,  no treatment is needed. If the condition is long-lasting (chronic), or if symptoms are severe, treatment may involve surgery or lifestyle changes, such as following a weight loss plan. HOME CARE INSTRUCTIONS Medicines  Take medicines only as directed by your health care provider.  Do not drive or operate heavy machinery while taking pain medicine. Injury Care  Apply a heat pack to the injured area as directed by your health care provider.  Apply ice to the affected area:  Put ice in a plastic bag.  Place a towel between your skin and the bag.  Leave the ice on for 20-30 minutes, every 2 hours while you are awake or as needed. Or, leave the ice on for as long as directed by your health care provider. Other Instructions  If you were shown how to do any exercises or stretches, do them as directed by your health care provider.  If your health care provider prescribed a diet or exercise program, follow it as directed.  Keep all follow-up visits as directed by your health care provider. This is important. SEEK MEDICAL CARE IF:  Your pain does not improve over time even when taking pain medicines. SEEK IMMEDIATE MEDICAL CARE IF:  Your develop severe pain.  Your pain suddenly gets worse.  You develop increasing weakness in your legs.  You lose the ability to control your bladder or bowel.  You have difficulty walking or balancing.  You have a fever.   This information is not intended to replace advice given to you by your health care provider. Make sure you  discuss any questions you have with your health care provider.   Document Released: 09/10/2005 Document Revised: 01/25/2015 Document Reviewed: 09/06/2014 Elsevier Interactive Patient Education [employer]2016 Elsevier Inc.

## 2015-10-16 NOTE — ED Notes (Signed)
Pt had all belongings at d/c.  

## 2015-10-17 ENCOUNTER — Other Ambulatory Visit: Payer: Self-pay | Admitting: Medical

## 2015-10-17 MED ORDER — MOMETASONE FURO-FORMOTEROL FUM 100-5 MCG/ACT IN AERO: INHALATION_SPRAY | RESPIRATORY_TRACT | Status: DC

## 2015-10-17 MED ORDER — EPINEPHRINE 0.3 MG/0.3ML IJ SOAJ: 0.3000 mg | Freq: Once | INTRAMUSCULAR | Status: DC

## 2015-10-17 MED ORDER — ALBUTEROL SULFATE HFA 108 (90 BASE) MCG/ACT IN AERS: 2.0000 | INHALATION_SPRAY | Freq: Four times a day (QID) | RESPIRATORY_TRACT | Status: DC | PRN

## 2015-10-26 DIAGNOSIS — Z2821 Immunization not carried out because of patient refusal: Secondary | ICD-10-CM

## 2015-10-26 DIAGNOSIS — Z5320 Procedure and treatment not carried out because of patient's decision for unspecified reasons: Secondary | ICD-10-CM

## 2015-10-26 HISTORY — DX: Procedure and treatment not carried out because of patient's decision for unspecified reasons: Z53.20

## 2015-10-26 HISTORY — DX: Immunization not carried out because of patient refusal: Z28.21

## 2015-10-27 ENCOUNTER — Encounter: Payer: Self-pay | Admitting: Medical

## 2015-10-27 ENCOUNTER — Ambulatory Visit (INDEPENDENT_AMBULATORY_CARE_PROVIDER_SITE_OTHER): Payer: BLUE CROSS/BLUE SHIELD | Admitting: Medical

## 2015-10-27 VITALS — BP 120/78 | HR 85 | Ht 70.75 in | Wt 236.0 lb

## 2015-10-27 DIAGNOSIS — Z0000 Encounter for general adult medical examination without abnormal findings: Secondary | ICD-10-CM

## 2015-10-27 DIAGNOSIS — K645 Perianal venous thrombosis: Secondary | ICD-10-CM | POA: Diagnosis not present

## 2015-10-27 DIAGNOSIS — Z1211 Encounter for screening for malignant neoplasm of colon: Secondary | ICD-10-CM | POA: Insufficient documentation

## 2015-10-27 DIAGNOSIS — J309 Allergic rhinitis, unspecified: Secondary | ICD-10-CM

## 2015-10-27 DIAGNOSIS — Z8349 Family history of other endocrine, nutritional and metabolic diseases: Secondary | ICD-10-CM | POA: Diagnosis not present

## 2015-10-27 DIAGNOSIS — J209 Acute bronchitis, unspecified: Secondary | ICD-10-CM | POA: Diagnosis not present

## 2015-10-27 DIAGNOSIS — Z125 Encounter for screening for malignant neoplasm of prostate: Secondary | ICD-10-CM | POA: Diagnosis not present

## 2015-10-27 DIAGNOSIS — J4540 Moderate persistent asthma, uncomplicated: Secondary | ICD-10-CM

## 2015-10-27 DIAGNOSIS — Z7185 Encounter for immunization safety counseling: Secondary | ICD-10-CM

## 2015-10-27 DIAGNOSIS — Z7189 Other specified counseling: Secondary | ICD-10-CM

## 2015-10-27 DIAGNOSIS — E669 Obesity, unspecified: Secondary | ICD-10-CM | POA: Diagnosis not present

## 2015-10-27 LAB — LIPID PANEL
CHOLESTEROL: 214 mg/dL — AB (ref 125–200)
HDL: 57 mg/dL (ref >=40)
LDL CALC: 137 mg/dL — AB (ref <130)
TRIGLYCERIDES: 102 mg/dL (ref <150)
Total CHOL/HDL Ratio: 3.8 Ratio (ref <=5.0)
VLDL: 20 mg/dL (ref <30)

## 2015-10-27 LAB — CBC
HEMATOCRIT: 48.5 % (ref 39.0–52.0)
HEMOGLOBIN: 15.9 g/dL (ref 13.0–17.0)
MCH: 27.8 pg (ref 26.0–34.0)
MCHC: 32.8 g/dL (ref 30.0–36.0)
MCV: 84.9 fL (ref 78.0–100.0)
MPV: 11 fL (ref 8.6–12.4)
Platelets: 280 10*3/uL (ref 150–400)
RBC: 5.71 MIL/uL (ref 4.22–5.81)
RDW: 13.7 % (ref 11.5–15.5)
WBC: 12.9 10*3/uL — AB (ref 4.0–10.5)

## 2015-10-27 LAB — COMPREHENSIVE METABOLIC PANEL
ALBUMIN: 4.4 g/dL (ref 3.6–5.1)
ALT: 33 U/L (ref 9–46)
AST: 27 U/L (ref 10–35)
Alkaline Phosphatase: 82 U/L (ref 40–115)
BUN: 12 mg/dL (ref 7–25)
CALCIUM: 9.5 mg/dL (ref 8.6–10.3)
CO2: 28 mmol/L (ref 20–31)
Chloride: 102 mmol/L (ref 98–110)
Creat: 1 mg/dL (ref 0.70–1.33)
Glucose, Bld: 86 mg/dL (ref 65–99)
Potassium: 4.8 mmol/L (ref 3.5–5.3)
SODIUM: 140 mmol/L (ref 135–146)
Total Bilirubin: 0.6 mg/dL (ref 0.2–1.2)
Total Protein: 7.2 g/dL (ref 6.1–8.1)

## 2015-10-27 LAB — T4, FREE: FREE T4: 1.06 ng/dL (ref 0.80–1.80)

## 2015-10-27 LAB — TSH: TSH: 2.323 u[IU]/mL (ref 0.350–4.500)

## 2015-10-27 LAB — HEMOGLOBIN A1C
Hgb A1c MFr Bld: 6 % — ABNORMAL HIGH (ref <5.7)
MEAN PLASMA GLUCOSE: 126 mg/dL — AB (ref <117)

## 2015-10-27 MED ORDER — PREDNISONE 10 MG PO TABS: ORAL_TABLET | ORAL | Status: DC

## 2015-10-27 MED ORDER — AZITHROMYCIN 250 MG PO TABS: ORAL_TABLET | ORAL | Status: DC

## 2015-10-27 MED ORDER — ALBUTEROL SULFATE HFA 108 (90 BASE) MCG/ACT IN AERS: 2.0000 | INHALATION_SPRAY | Freq: Four times a day (QID) | RESPIRATORY_TRACT | Status: DC | PRN

## 2015-10-27 MED ORDER — MOMETASONE FURO-FORMOTEROL FUM 100-5 MCG/ACT IN AERO
INHALATION_SPRAY | RESPIRATORY_TRACT | Status: DC
Start: 2015-10-27 — End: 2016-09-19

## 2015-10-27 NOTE — Progress Notes (Signed)
Subjective:   HPI  Kristopher Gomez is a 50 y.o. male who presents for a physical. Accompanied by wife.       Medical care team includes: Ernst Breach, PA-C Dr. Jene Every, [facility]  Concerns: Asthma - last PFT 1 years ago.  On Dulera 2 puffs BID now, was on singular in the past.   Uses Albuterol 2 x per week.   Prior includes Symbicort, Advair, others.    currently been dealing with flare up and productive sputum along with cough and wheezing for about 3-4 days.   Using nebs yesterday and HFI last few days.     Seeing ortho about back, thinks he ruptured disc about a month or so ago.  Reviewed their medical, surgical, family, social, medication, and allergy history and updated chart as appropriate.  Past Medical History  Diagnosis Date  . Allergy   . Asthma   . Former smoker     quit 1994, prior was 20pack year+ smoker  . Influenza vaccination declined 10/2015  . Pneumococcal vaccination declined 10/2015  . Colonoscopy refused 10/2015    Past Surgical History  Procedure Laterality Date  . Cervical fusion    . Vasectomy    . Carpal tunnel release    . Dental surgery      all teeth  . Nose surgery      broke nose / sinus cavity  . Tonsillectomy    . Colonoscopy  1994    rectal bleeding  . Back surgery    . Knee arthroscopy  2016    left; Dr. August Saucer  . Shoulder surgery  2016    arthroscopic for labral tear; left, Dr. August Saucer    Social History   Social History  . Marital Status: Married    Spouse Name: N/A  . Number of Children: 8  . Years of Education: N/A   Occupational History  . pre-loading [employer]   Social History Main Topics  . Smoking status: Former Smoker -- 1.00 packs/day for 20 years    Quit date: 09/25/1995  . Smokeless tobacco: Never Used  . Alcohol Use: No  . Drug Use: No  . Sexual Activity: Yes    Birth Control/ Protection: Surgical   Other Topics Concern  . Not on file   Social History Narrative   Married, works at [employer],  walking, was doing a lot before shoulder and knee issues as of 09/2014.  8 children, grandchildren.   As of 10/2015    Family History  Problem Relation Age of Onset  . Asthma Father   . Asthma Brother   . Cancer Daughter     ALL LEUKEMIA  . Cancer Maternal Aunt     breast  . Cancer Maternal Aunt     breast  . Cancer Maternal Aunt     breast  . Heart disease Mother 71    Afib  . Alcohol abuse Mother   . Thyroid disease Mother     died of thyroid complications  . Diabetes Maternal Uncle   . Stroke Neg Hx   . Asthma Brother      Current outpatient prescriptions:  .  albuterol (PROVENTIL HFA;VENTOLIN HFA) 108 (90 Base) MCG/ACT inhaler, Inhale 2 puffs into the lungs every 6 (six) hours as needed for wheezing. PATIENT NEEDS A 90 DAY SUPPLY, Disp: 18 g, Rfl: 2 .  mometasone-formoterol (DULERA) 100-5 MCG/ACT AERO, INHALE 2 PUFFS INTO THE LUNGS 2 (TWO) TIMES DAILY., Disp: 1 Inhaler, Rfl: 3 .  OVER THE COUNTER MEDICATION, , Disp: , Rfl:  .  OxyCODONE HCl, Abuse Deter, (OXAYDO) 5 MG TABA, Take 1 tablet by mouth at bedtime., Disp: , Rfl:  .  azithromycin (ZITHROMAX) 250 MG tablet, 2 tablets day 1, then 1 tablet days 2-4, Disp: 6 tablet, Rfl: 0 .  diazepam (VALIUM) 5 MG tablet, Take 1 tablet (5 mg total) by mouth every 8 (eight) hours as needed for anxiety or muscle spasms. (Patient not taking: Reported on 10/27/2015), Disp: 15 tablet, Rfl: 0 .  EPINEPHrine 0.3 mg/0.3 mL IJ SOAJ injection, Inject 0.3 mLs (0.3 mg total) into the muscle once. (Patient not taking: Reported on 10/27/2015), Disp: 1 Device, Rfl: 0 .  fexofenadine (ALLEGRA) 180 MG tablet, Take 180 mg by mouth daily. Reported on 10/27/2015, Disp: , Rfl:  .  fluticasone (FLONASE) 50 MCG/ACT nasal spray, PLACE 2 SPRAYS INTO BOTH NOSTRILS DAILY. (Patient not taking: Reported on 10/27/2015), Disp: 16 g, Rfl: 0 .  naproxen (NAPROSYN) 500 MG tablet, Take 1 tablet (500 mg total) by mouth 2 (two) times daily. (Patient not taking: Reported on 10/27/2015),  Disp: 30 tablet, Rfl: 0 .  NON FORMULARY, Reported on 10/27/2015, Disp: , Rfl:  .  oxyCODONE-acetaminophen (PERCOCET) 5-325 MG tablet, Take 1 tablet by mouth every 4 (four) hours as needed for severe pain. (Patient not taking: Reported on 10/27/2015), Disp: 20 tablet, Rfl: 0 .  predniSONE (DELTASONE) 10 MG tablet, 6/5/4/3/2/1 taper, Disp: 21 tablet, Rfl: 0 .  PROAIR HFA 108 (90 BASE) MCG/ACT inhaler, INAHALE 2 PUFFS INTO THE LUNGS EVERY 6 HOURS AS NEEDED FOR WHEEZING OR SHORTNESS OF BREATH (Patient not taking: Reported on 10/27/2015), Disp: 25.5 Inhaler, Rfl: 0  Allergies  Allergen Reactions  . Vicodin [Hydrocodone-Acetaminophen] Anaphylaxis  . Advair Diskus [Fluticasone-Salmeterol]     Can take Symbicort  . Morphine And Related Nausea And Vomiting  . Penicillins   . Prednisone     Wife says he is not pleasant with this  . Tramadol Nausea And Vomiting    Review of Systems Constitutional: -fever, -chills, -sweats, -unexpected weight change, -decreased appetite, -fatigue Allergy: -sneezing, -itching, -congestion Dermatology: -changing moles, --rash, -lumps ENT: -runny nose, -ear pain, -sore throat, -hoarseness, -sinus pain, -teeth pain, - ringing in ears, -hearing loss, -nosebleeds Cardiology: -chest pain, -palpitations, -swelling, -difficulty breathing when lying flat, +waking up short of breath Respiratory: -cough, +shortness of breath, -difficulty breathing with exercise or exertion, +wheezing, -coughing up blood Gastroenterology: -abdominal pain, -nausea, -vomiting, -diarrhea, -constipation, -blood in stool, -changes in bowel movement, -difficulty swallowing or eating Hematology: -bleeding, -bruising  Musculoskeletal: +joint aches, -muscle aches,+joint swelling, +back pain, +neck pain, -cramping, -changes in gait Ophthalmology: denies vision changes, eye redness, itching, discharge Urology: -burning with urination, -difficulty urinating, -blood in urine, -urinary frequency, -urgency,  -incontinence Neurology: -headache, -weakness, -tingling, -numbness, -memory loss, -falls, -dizziness Psychology: -depressed mood, -agitation, -sleep problems     Objective:   Physical Exam  BP 120/78 mmHg  Pulse 85  Ht 5' 10.75" (1.797 m)  Wt 236 lb (107.049 kg)  BMI 33.15 kg/m2  SpO2 96%  General appearance: alert, no distress, WD/WN, white male Skin: scattered benign appearing macules HEENT: normocephalic, conjunctiva/corneas normal, sclerae anicteric, PERRLA, EOMi, nares patent, no discharge or erythema, pharynx normal Oral cavity: MMM, tongue normal, teeth - upper and lower dentures Neck: supple, no lymphadenopathy, no thyromegaly, no masses, normal ROM, no bruits Chest: non tender, normal shape and expansion Heart: RRR, normal S1, S2, no murmurs Lungs: few scattered faint wheezes, no rhonchi,  or rales Abdomen: +bs, soft, non tender, non distended, no masses, no hepatomegaly, no splenomegaly, no bruits Back: tender lumbar region and decreased and slow ROM due to pain, otherwise  tender, normal ROM, no scoliosis Musculoskeletal:  Port surgical scars of left shoulder and left knee, otherwise upper extremities non tender, no obvious deformity, normal ROM throughout, lower extremities non tender, no obvious deformity, normal ROM throughout Extremities: no edema, no cyanosis, no clubbing Pulses: 2+ symmetric, upper and lower extremities, normal cap refill Neurological: alert, oriented x 3, CN2-12 intact, strength normal upper extremities and lower extremities, sensation normal throughout, DTRs 2+ throughout, no cerebellar signs, gait normal Psychiatric: normal affect, behavior normal, pleasant  GU: normal male external genitalia, circumcised, mild varicoceles bilat, nontender, no masses, no hernia, no lymphadenopathy Rectal: small external hemorrhoid, anus normal tone, prostate WNL   Assessment and Plan :    Encounter Diagnoses  Name Primary?  . Encounter for health maintenance  examination in adult Yes  . Moderate persistent asthma in adult without complication   . Hemorrhoids, internal, thrombosed   . Allergic rhinitis, unspecified allergic rhinitis type   . Obesity   . Family history of thyroid disease   . Screening for prostate cancer   . Special screening for malignant neoplasms, colon   . Vaccine counseling   . Acute bronchitis, unspecified organism    Physical exam - discussed healthy lifestyle, diet, exercise, preventative care, vaccinations, and addressed their concerns.   See your eye doctor yearly for routine vision care. See your dentist yearly for routine dental care including hygiene visits twice yearly. Former smoker Asthma - doing ok on Dulera and Albuterol in general.  Begin zpak and prednisone for bronchitis although he knows he gets irritable with prednisone, he wants to go this route. He declines colonoscopy.   He will look into [employer] approval PSA testing today after discussing risks/benefits of testing Return soon for updated PFTs when asymptomatic Declines flu and pneumococcal vaccines. Follow-up pending studies.

## 2015-10-28 ENCOUNTER — Encounter: Payer: Self-pay | Admitting: Medical

## 2015-10-28 LAB — PSA: PSA: 2.06 ng/mL (ref <=4.00)

## 2015-11-05 ENCOUNTER — Encounter: Payer: Self-pay | Admitting: Medical

## 2015-11-06 ENCOUNTER — Emergency Department (HOSPITAL_COMMUNITY)
Admission: EM | Admit: 2015-11-06 | Discharge: 2015-11-06 | Disposition: A | Discharge status: Home / Self Care | Payer: BLUE CROSS/BLUE SHIELD | Attending: Emergency Medicine | Admitting: Emergency Medicine

## 2015-11-06 ENCOUNTER — Emergency Department (HOSPITAL_COMMUNITY): Payer: BLUE CROSS/BLUE SHIELD

## 2015-11-06 ENCOUNTER — Encounter (HOSPITAL_COMMUNITY): Payer: Self-pay | Admitting: Emergency Medicine

## 2015-11-06 DIAGNOSIS — J45901 Unspecified asthma with (acute) exacerbation: Secondary | ICD-10-CM | POA: Diagnosis not present

## 2015-11-06 DIAGNOSIS — H938X9 Other specified disorders of ear, unspecified ear: Secondary | ICD-10-CM | POA: Diagnosis not present

## 2015-11-06 DIAGNOSIS — Z880 Allergy status to penicillin: Secondary | ICD-10-CM | POA: Insufficient documentation

## 2015-11-06 DIAGNOSIS — Z7951 Long term (current) use of inhaled steroids: Secondary | ICD-10-CM | POA: Diagnosis not present

## 2015-11-06 DIAGNOSIS — Z87891 Personal history of nicotine dependence: Secondary | ICD-10-CM | POA: Diagnosis not present

## 2015-11-06 DIAGNOSIS — J069 Acute upper respiratory infection, unspecified: Secondary | ICD-10-CM | POA: Diagnosis not present

## 2015-11-06 DIAGNOSIS — J0100 Acute maxillary sinusitis, unspecified: Secondary | ICD-10-CM | POA: Diagnosis not present

## 2015-11-06 DIAGNOSIS — J3489 Other specified disorders of nose and nasal sinuses: Secondary | ICD-10-CM | POA: Diagnosis present

## 2015-11-06 MED ORDER — ALBUTEROL SULFATE (2.5 MG/3ML) 0.083% IN NEBU
5.0000 mg | INHALATION_SOLUTION | Freq: Once | RESPIRATORY_TRACT | Status: AC
  Administered 2015-11-06: 5 mg via RESPIRATORY_TRACT

## 2015-11-06 MED ORDER — OXYMETAZOLINE HCL 0.05 % NA SOLN: 1.0000 | Freq: Two times a day (BID) | NASAL | Status: DC

## 2015-11-06 MED ORDER — ALBUTEROL SULFATE (2.5 MG/3ML) 0.083% IN NEBU
INHALATION_SOLUTION | RESPIRATORY_TRACT | Status: AC
  Filled 2015-11-06: qty 3

## 2015-11-06 MED ORDER — ALBUTEROL SULFATE (2.5 MG/3ML) 0.083% IN NEBU: 5.0000 mg | INHALATION_SOLUTION | Freq: Once | RESPIRATORY_TRACT | Status: DC

## 2015-11-06 MED ORDER — DOXYCYCLINE HYCLATE 100 MG PO CAPS: 100.0000 mg | ORAL_CAPSULE | Freq: Two times a day (BID) | ORAL | Status: DC

## 2015-11-06 NOTE — ED Provider Notes (Signed)
CSN: [id number]     Arrival date & time 11/06/15  1158 History   First MD Initiated Contact with Patient 11/06/15 1553     Chief Complaint  Patient presents with  . Asthma   (Consider location/radiation/quality/duration/timing/severity/associated sxs/prior Treatment) HPI 50 y.o. male with a hx of Asthma, presents to the Emergency Department today complaining of URI symptoms x 3 weeks. Notes that he saw his PCP on 10-27-15 and was given ABD (Azithromycin) as well as steroids. Has chronic Asthma problems and see by PCP for complaint. Pt more worried about URI symptoms (sore throat, rhinorrhea, sinus pressure, ear pressure). No pain. No CP/ABD pain. No N/V/D. No headaches. No numbness/tingling. No other symptoms noted.      Past Medical History  Diagnosis Date  . Allergy   . Asthma   . Former smoker     quit 1994, prior was 20pack year+ smoker  . Influenza vaccination declined 10/2015  . Pneumococcal vaccination declined 10/2015  . Colonoscopy refused 10/2015   Past Surgical History  Procedure Laterality Date  . Cervical fusion    . Vasectomy    . Carpal tunnel release    . Dental surgery      all teeth  . Nose surgery      broke nose / sinus cavity  . Tonsillectomy    . Colonoscopy  1994    rectal bleeding  . Back surgery    . Knee arthroscopy  2016    left; Dr. Shelle Iron  . Shoulder surgery  2016    arthroscopic for labral tear; left, Dr. Shelle Iron   Family History  Problem Relation Age of Onset  . Asthma Father   . Asthma Brother   . Cancer Daughter     ALL LEUKEMIA  . Cancer Maternal Aunt     breast  . Cancer Maternal Aunt     breast  . Cancer Maternal Aunt     breast  . Heart disease Mother 60    Afib  . Alcohol abuse Mother   . Thyroid disease Mother     died of thyroid complications  . Diabetes Maternal Uncle   . Stroke Neg Hx   . Asthma Brother    Social History  Substance Use Topics  . Smoking status: Former Smoker -- 1.00 packs/day for 20 years    Quit date:  09/25/1995  . Smokeless tobacco: Never Used  . Alcohol Use: No    Review of Systems ROS reviewed and all are negative for acute change except as noted in the HPI.  Allergies  Penicillins; Vicodin; Advair diskus; Morphine and related; Prednisone; and Tramadol  Home Medications   Prior to Admission medications   Medication Sig Start Date End Date Taking? Authorizing Provider  EPINEPHrine 0.3 mg/0.3 mL IJ SOAJ injection Inject 0.3 mLs (0.3 mg total) into the muscle once. 10/17/15  Yes David S Tysinger, PA-C  fluticasone (FLONASE) 50 MCG/ACT nasal spray PLACE 2 SPRAYS INTO BOTH NOSTRILS DAILY. Patient taking differently: PLACE 2 SPRAYS INTO BOTH NOSTRILS DAILY as needed for congestion 06/10/15  Yes Kermit Balo Tysinger, PA-C  mometasone-formoterol (DULERA) 100-5 MCG/ACT AERO INHALE 2 PUFFS INTO THE LUNGS 2 (TWO) TIMES DAILY. 10/27/15  Yes Kermit Balo Tysinger, PA-C  OVER THE COUNTER MEDICATION Take 15 mLs by mouth daily.    Yes Historical Provider, MD  oxyCODONE (OXY IR/ROXICODONE) 5 MG immediate release tablet Take 5 mg by mouth at bedtime as needed for moderate pain.  10/26/15  Yes Historical Provider, MD  PROAIR HFA 108 (90 BASE) MCG/ACT inhaler INAHALE 2 PUFFS INTO THE LUNGS EVERY 6 HOURS AS NEEDED FOR WHEEZING OR SHORTNESS OF BREATH 05/13/15  Yes Kermit Balo Tysinger, PA-C  albuterol (PROVENTIL HFA;VENTOLIN HFA) 108 (90 Base) MCG/ACT inhaler Inhale 2 puffs into the lungs every 6 (six) hours as needed for wheezing. PATIENT NEEDS A 90 DAY SUPPLY Patient not taking: Reported on 11/06/2015 10/27/15   Kermit Balo Tysinger, PA-C  azithromycin (ZITHROMAX) 250 MG tablet 2 tablets day 1, then 1 tablet days 2-4 Patient not taking: Reported on 11/06/2015 10/27/15   Kermit Balo Tysinger, PA-C  diazepam (VALIUM) 5 MG tablet Take 1 tablet (5 mg total) by mouth every 8 (eight) hours as needed for anxiety or muscle spasms. Patient not taking: Reported on 10/27/2015 10/16/15   Tatyana Kirichenko, PA-C  naproxen (NAPROSYN) 500 MG tablet  Take 1 tablet (500 mg total) by mouth 2 (two) times daily. Patient not taking: Reported on 11/06/2015 10/16/15   Jaynie Crumble, PA-C  oxyCODONE-acetaminophen (PERCOCET) 5-325 MG tablet Take 1 tablet by mouth every 4 (four) hours as needed for severe pain. Patient not taking: Reported on 10/27/2015 10/16/15   Jaynie Crumble, PA-C  predniSONE (DELTASONE) 10 MG tablet 6/5/4/3/2/1 taper Patient not taking: Reported on 11/06/2015 10/27/15   Kermit Balo Tysinger, PA-C   BP 138/86 mmHg  Pulse 67  Temp(Src) 98.4 F (36.9 C) (Oral)  Resp 16  SpO2 100%   Physical Exam  Constitutional: He is oriented to person, place, and time. He appears well-developed and well-nourished.  HENT:  Head: Normocephalic and atraumatic.  Right Ear: Tympanic membrane, external ear and ear canal normal.  Left Ear: Tympanic membrane, external ear and ear canal normal.  Nose: Rhinorrhea present. Right sinus exhibits maxillary sinus tenderness. Left sinus exhibits maxillary sinus tenderness.  Mouth/Throat: Uvula is midline, oropharynx is clear and moist and mucous membranes are normal.  Eyes: EOM are normal. Pupils are equal, round, and reactive to light.  Neck: Trachea normal, normal range of motion, full passive range of motion without pain and phonation normal. Neck supple.  Cardiovascular: Normal rate, regular rhythm, S1 normal, S2 normal, normal heart sounds, intact distal pulses and normal pulses.   Pulmonary/Chest: Effort normal. No respiratory distress. He has wheezes in the right upper field, the right lower field, the left upper field and the left lower field.  Abdominal: Soft. Normal appearance and bowel sounds are normal. There is no tenderness.  Musculoskeletal: Normal range of motion.  Neurological: He is alert and oriented to person, place, and time.  Skin: Skin is warm and dry.  Psychiatric: He has a normal mood and affect. His behavior is normal. Thought content normal.  Nursing note and vitals  reviewed.   ED Course  Procedures (including critical care time) Labs Review Labs Reviewed - No data to display  Imaging Review Dg Chest 2 View  11/06/2015  CLINICAL DATA:  Fever, cough, difficulty breathing EXAM: CHEST  2 VIEW COMPARISON:  10/07/2014 FINDINGS: Lungs are clear.  No pleural effusion or pneumothorax. The heart is normal in size. Visualized osseous structures are within normal limits. IMPRESSION: Normal chest radiographs. Electronically Signed   By: Charline Bills M.D.   On: 11/06/2015 17:13   I have personally reviewed and evaluated these images and lab results as part of my medical decision-making.   EKG Interpretation None      MDM  I have reviewed relevant laboratory values. I have reviewed relevant imaging studies. I have reviewed the relevant previous  healthcare records.I obtained HPI from historian. Patient discussed with supervising physician  ED Course:  Assessment: 36y M presents with SOB/Cough/URI symptoms x3 weeks. Pt CXR negative for acute infiltrate. On exam, patients symptoms are consistent with URI (sinus pressure, ear pressure, rhinorrhea). Possible Sinusitis. VSS. Afebrile. NAD. Able to tolerate PO. Able to ambulate. Pt will be discharged with symptomatic treatment with Afrin and ABX (doxycycline).  Verbalizes understanding and is agreeable with plan. Pt is hemodynamically stable & in NAD prior to dc.  Disposition/Plan:  DC Home Additional Verbal discharge instructions given and discussed with patient.  Pt Instructed to f/u with PCP in the next 48-72 hours for evaluation and treatment of symptoms. Return precautions given Pt acknowledges and agrees with plan  Supervising Physician Vanetta Mulders, MD   Final diagnoses:  URI (upper respiratory infection)  Acute maxillary sinusitis, recurrence not specified       Audry Pili, PA-C 11/06/15 1729  Vanetta Mulders, MD 11/06/15 1731

## 2015-11-06 NOTE — ED Notes (Signed)
Pt c/o URI x3 weeks. Pt given antibiotics and steroid and it didn't help. Pt had one nebulizer treatment without help. Wheezing in all lung fields.

## 2015-11-06 NOTE — Discharge Instructions (Signed)
Please read and follow all provided instructions.  Your diagnoses today include:  1. URI (upper respiratory infection)   2. Acute maxillary sinusitis, recurrence not specified    Tests performed today include:  Vital signs. See below for your results today.   Medications prescribed:   Doxycycline. Take as Prescribed    Afrin Oxymetazoline - nasal spray for congestion. Do not use for more than 3 days because this medicine can cause rebound congestion.     Home care instructions:  Follow any educational materials contained in this packet.  Follow-up instructions: Please follow-up with your primary care provider in the next 48 hours for further evaluation of symptoms and treatment   Return instructions:   Please return to the Emergency Department if you do not get better, if you get worse, or new symptoms OR  - Fever (temperature greater than 101.80F)  - Bleeding that does not stop with holding pressure to the area    -Severe pain (please note that you may be more sore the day after your accident)  - Chest Pain  - Difficulty breathing  - Severe nausea or vomiting  - Inability to tolerate food and liquids  - Passing out  - Skin becoming red around your wounds  - Change in mental status (confusion or lethargy)  - New numbness or weakness     Please return if you have any other emergent concerns.  Additional Information:  Your vital signs today were: BP 138/86 mmHg   Pulse 67   Temp(Src) 98.4 F (36.9 C) (Oral)   Resp 16   SpO2 100% If your blood pressure (BP) was elevated above 135/85 this visit, please have this repeated by your doctor within one month. ---------------

## 2015-11-08 ENCOUNTER — Ambulatory Visit (INDEPENDENT_AMBULATORY_CARE_PROVIDER_SITE_OTHER): Payer: BLUE CROSS/BLUE SHIELD | Admitting: Family Medicine

## 2015-11-08 ENCOUNTER — Other Ambulatory Visit: Payer: Self-pay | Admitting: Medical

## 2015-11-08 ENCOUNTER — Encounter: Payer: Self-pay | Admitting: Family Medicine

## 2015-11-08 VITALS — BP 116/70 | HR 91 | Temp 98.1°F | Wt 237.2 lb

## 2015-11-08 DIAGNOSIS — J4540 Moderate persistent asthma, uncomplicated: Secondary | ICD-10-CM

## 2015-11-08 DIAGNOSIS — J309 Allergic rhinitis, unspecified: Secondary | ICD-10-CM

## 2015-11-08 DIAGNOSIS — J209 Acute bronchitis, unspecified: Secondary | ICD-10-CM | POA: Diagnosis not present

## 2015-11-08 MED ORDER — ALBUTEROL SULFATE (2.5 MG/3ML) 0.083% IN NEBU: 5.0000 mg | INHALATION_SOLUTION | Freq: Once | RESPIRATORY_TRACT | Status: DC

## 2015-11-08 MED ORDER — PREDNISONE 10 MG (48) PO TBPK
ORAL_TABLET | Freq: Every day | ORAL | Status: DC
Start: 2015-11-08 — End: 2016-09-19

## 2015-11-08 MED ORDER — METHYLPREDNISOLONE SODIUM SUCC 125 MG IJ SOLR
125.0000 mg | Freq: Once | INTRAMUSCULAR | Status: AC
  Administered 2015-11-08: 125 mg via INTRAMUSCULAR

## 2015-11-08 NOTE — Progress Notes (Signed)
   Subjective:    Patient ID: Kristopher Gomez, male    DOB: 1966/07/12, 50 y.o.   MRN: [id number]  HPI He was seen on February 2 and treated for cough and wheezing with azithromycin and prednisone. He states that this did not help at all. On February 12 T [facility] and sent to the emergency room for further care. He did have blood work and a chest x-ray which was negative. He was placed on doxycycline. He does complain of chest discomfort, malaise, fatigue,photophobia, shortness of breath, Productive cough, PND with sinus congestion but no sore throat or earache.. He continues on his inhalers and has been using his rescue inhaler quite regularly.Also has underlying allergies.  Review of Systems     Objective:   Physical Exam Alert and in no distress. Tympanic membranes and canals are normal. Pharyngeal area is normal. Neck is supple without adenopathy or thyromegaly. Cardiac exam shows a regular sinus rhythm without murmurs or gallops. Lungs Show bilateral expiratory wheezing.       Assessment & Plan:  Moderate persistent asthma in adult without complication - Plan: predniSONE (STERAPRED UNI-PAK 48 TAB) 10 MG (48) TBPK tablet, methylPREDNISolone sodium succinate (SOLU-MEDROL) 125 mg/2 mL injection 125 mg  Acute bronchitis, unspecified organism - Plan: predniSONE (STERAPRED UNI-PAK 48 TAB) 10 MG (48) TBPK tablet, methylPREDNISolone sodium succinate (SOLU-MEDROL) 125 mg/2 mL injection 125 mg  Allergic rhinitis, unspecified allergic rhinitis type He has not had a full 2 days dosing of the doxycycline to see if this will benefit him. After discussion I will place him on steroids to help with his coughing and wheezing and keep in touch with him in regard to his antibiotic. Hopefully this will let him return corner.

## 2016-03-11 IMAGING — CR DG CHEST 2V
2 series · 2 of 2 positions shown · non-contrast
Comparison: 10/07/2014

CLINICAL DATA: Fever, cough, difficulty breathing

EXAM:
CHEST  2 VIEW

[chest pa]
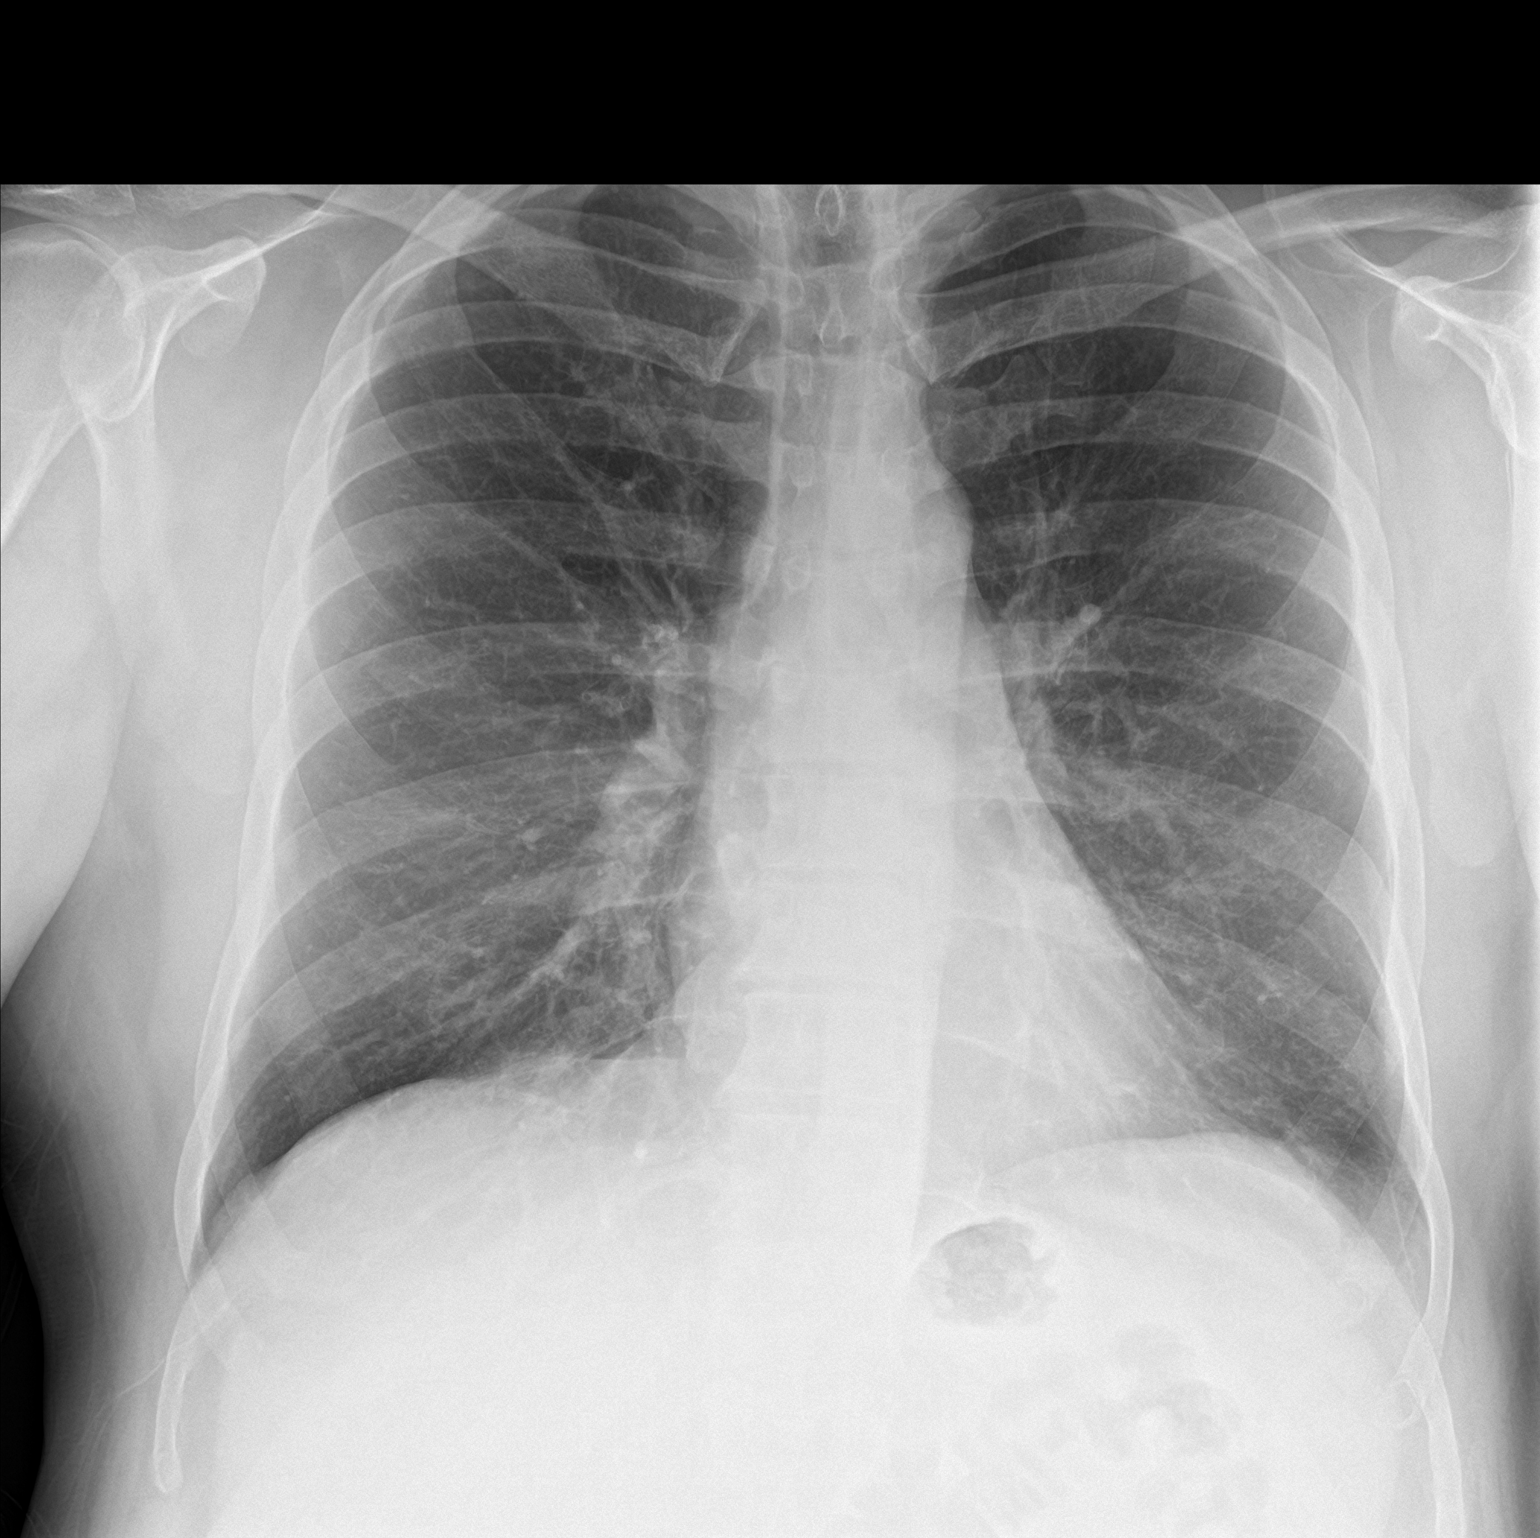

[chest lat]
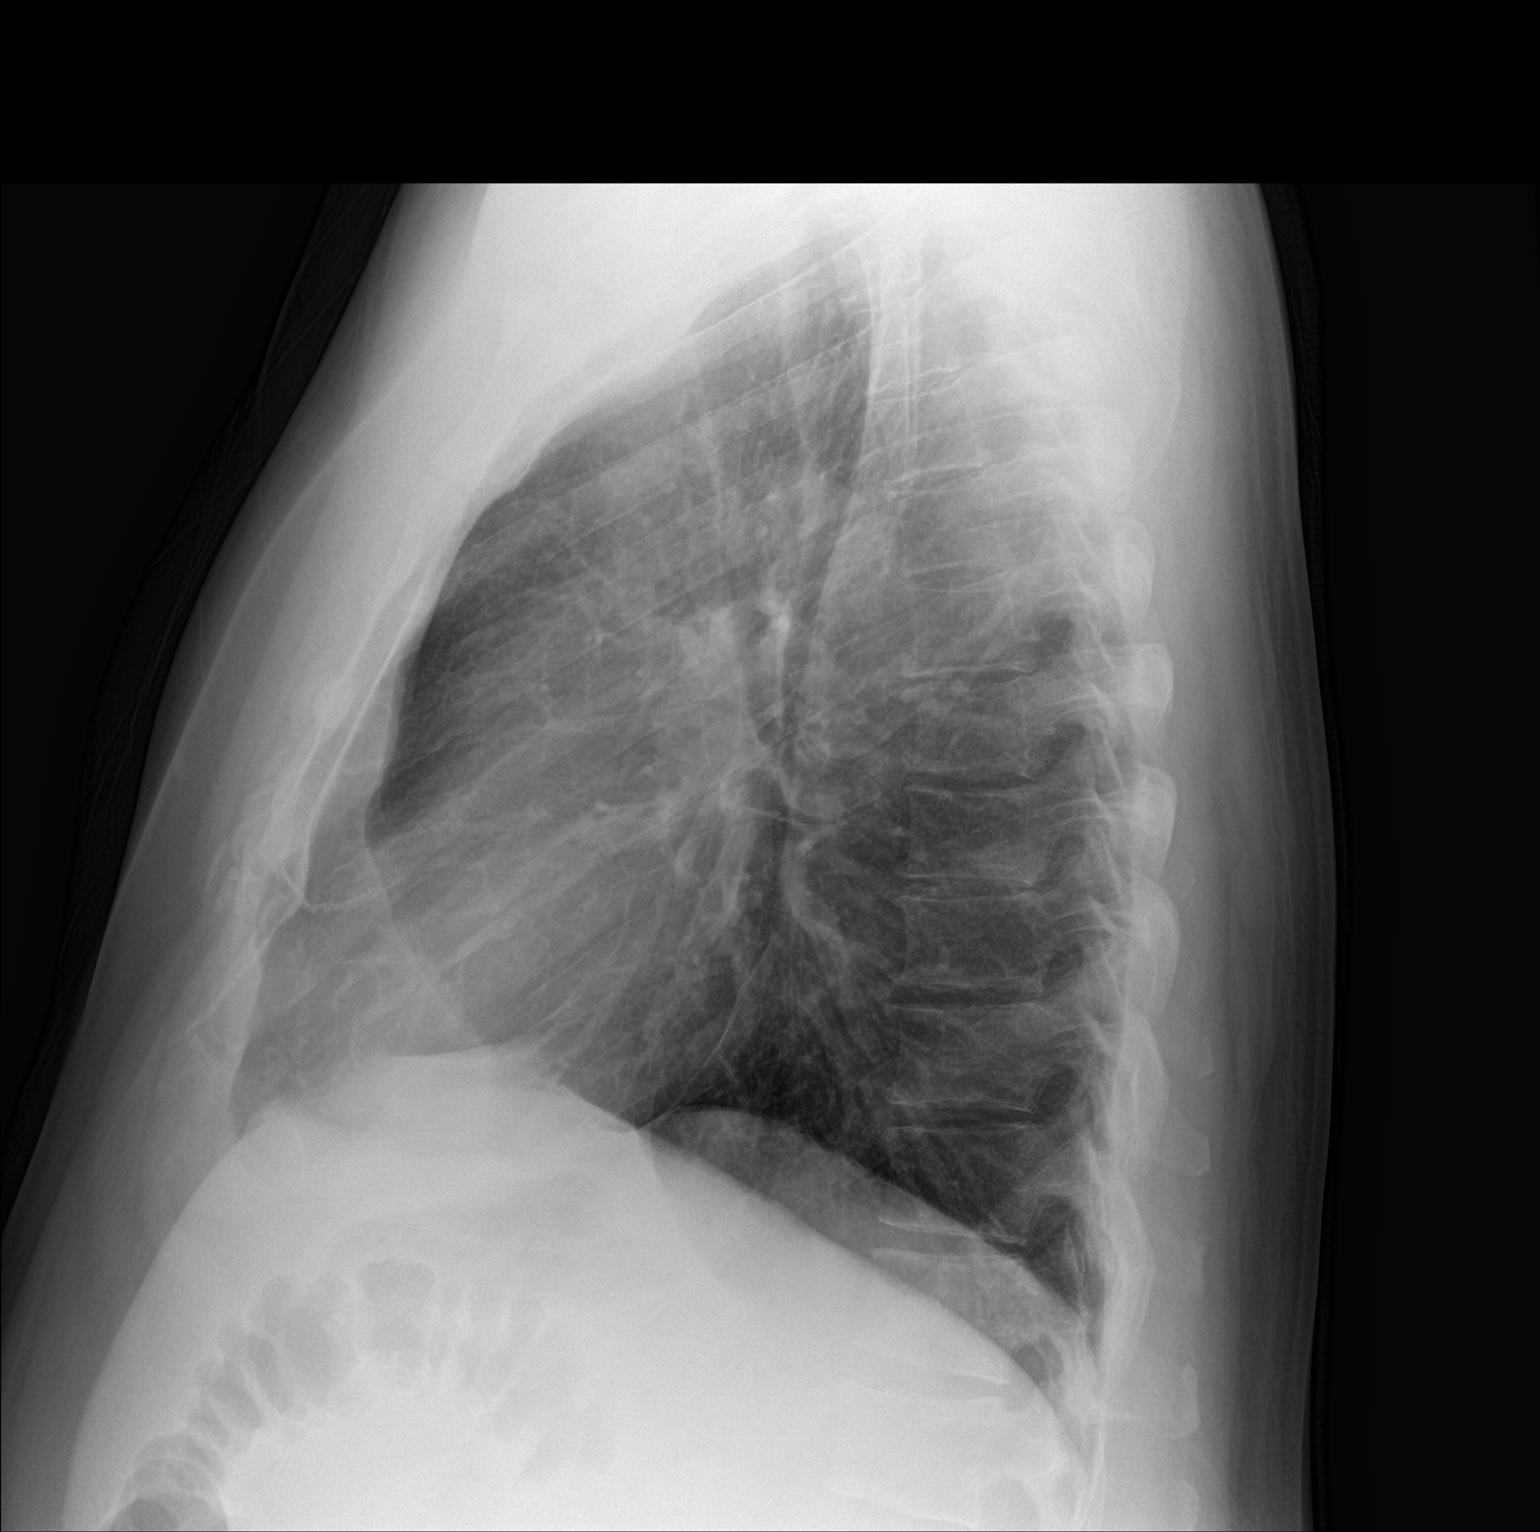

[2 of 2 positions shown; findings below may reference images not displayed]

FINDINGS: Lungs are clear.  No pleural effusion or pneumothorax.

The heart is normal in size.

Visualized osseous structures are within normal limits.
IMPRESSION: Normal chest radiographs.

## 2016-03-18 ENCOUNTER — Other Ambulatory Visit: Payer: Self-pay | Admitting: Medical

## 2016-04-13 ENCOUNTER — Other Ambulatory Visit: Payer: Self-pay | Admitting: Medical

## 2016-04-16 NOTE — Telephone Encounter (Signed)
Is this ok to refill?  

## 2016-04-18 ENCOUNTER — Emergency Department (HOSPITAL_COMMUNITY)
Admission: EM | Admit: 2016-04-18 | Discharge: 2016-04-18 | Disposition: A | Discharge status: Home / Self Care | Payer: Worker's Compensation | Attending: Emergency Medicine | Admitting: Emergency Medicine

## 2016-04-18 ENCOUNTER — Emergency Department (HOSPITAL_COMMUNITY): Payer: Worker's Compensation

## 2016-04-18 ENCOUNTER — Encounter (HOSPITAL_COMMUNITY): Payer: Self-pay

## 2016-04-18 DIAGNOSIS — Y9389 Activity, other specified: Secondary | ICD-10-CM | POA: Insufficient documentation

## 2016-04-18 DIAGNOSIS — Y990 Civilian activity done for income or pay: Secondary | ICD-10-CM | POA: Insufficient documentation

## 2016-04-18 DIAGNOSIS — Y929 Unspecified place or not applicable: Secondary | ICD-10-CM | POA: Insufficient documentation

## 2016-04-18 DIAGNOSIS — S83422A Sprain of lateral collateral ligament of left knee, initial encounter: Secondary | ICD-10-CM | POA: Insufficient documentation

## 2016-04-18 DIAGNOSIS — Z87891 Personal history of nicotine dependence: Secondary | ICD-10-CM | POA: Insufficient documentation

## 2016-04-18 DIAGNOSIS — S8992XA Unspecified injury of left lower leg, initial encounter: Secondary | ICD-10-CM | POA: Diagnosis present

## 2016-04-18 DIAGNOSIS — J45909 Unspecified asthma, uncomplicated: Secondary | ICD-10-CM | POA: Insufficient documentation

## 2016-04-18 DIAGNOSIS — X501XXA Overexertion from prolonged static or awkward postures, initial encounter: Secondary | ICD-10-CM | POA: Diagnosis not present

## 2016-04-18 DIAGNOSIS — S8392XA Sprain of unspecified site of left knee, initial encounter: Secondary | ICD-10-CM

## 2016-04-18 NOTE — ED Triage Notes (Signed)
Patient here with workers comp injury while working this am. Twisted left knee when belt he was working on stopped.

## 2016-04-18 NOTE — Discharge Instructions (Signed)
Please follow-up with orthopedic specialist for further evaluation and management. °

## 2016-04-18 NOTE — Progress Notes (Signed)
Orthopedic Tech Progress Note Patient Details:  Kristopher Gomez 07-25-1966 [id number]  Ortho Devices Type of Ortho Device: Knee Immobilizer Ortho Device/Splint Location: lle Ortho Device/Splint Interventions: Application   Trung Wenzl 04/18/2016, 12:40 PM

## 2016-04-18 NOTE — ED Notes (Signed)
Ortho called for knee immobilizer 

## 2016-04-18 NOTE — ED Notes (Signed)
Was at work today and  Kristopher Gomez stopped and he twisted his left knee inward, states just had that knee fixed last year by dr Shelle Iron, his knee hurts and his lower back, pt states did not fall and did not hit his head

## 2016-04-18 NOTE — ED Provider Notes (Signed)
[facility]-EMERGENCY DEPT Provider Note   CSN: [id number] Arrival date & time: 04/18/16  7829  First Provider Contact:  None   By signing my name below, I, Soijett Blue, attest that this documentation has been prepared under the direction and in the presence of Burna Forts, PA-C Electronically Signed: Soijett Blue, ED Scribe. 04/18/16. 11:48 AM.    History   Chief Complaint Chief Complaint  Patient presents with  . Knee Pain    HPI Kristopher Gomez is a 50 y.o. male who presents to the Emergency Department complaining of left knee pain onset 6:30 AM this morning. 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 Pt states that he has heard popping and cracking to his left knee since the initial injury. Pt notes that he works at [employer]. Pt states that he had an arthroscopy to his left knee completed last year. Denies falling or any other trauma at this time. Pt is having associated symptoms of gait problem due to pain and resolved left knee swelling. He notes that he has not tried any medications for the relief of his symptoms. He denies color change, wound, rash, and any other symptoms.    The history is provided by the patient. No language interpreter was used.    Past Medical History:  Diagnosis Date  . Allergy   . Asthma   . Colonoscopy refused 10/2015  . Former smoker    quit 1994, prior was 20pack year+ smoker  . Influenza vaccination declined 10/2015  . Pneumococcal vaccination declined 10/2015    Patient Active Problem List   Diagnosis Date Noted  . Obesity 10/27/2015  . Rhinitis, allergic 10/27/2015  . Family history of thyroid disease 10/27/2015  . Screening for prostate cancer 10/27/2015  . Special screening for malignant neoplasms, colon 10/27/2015  . Vaccine counseling 10/27/2015  . Acute bronchitis 10/27/2015  . Moderate persistent asthma in adult without complication 10/12/2014  . Hemorrhoids,  internal, thrombosed 10/09/2013    Past Surgical History:  Procedure Laterality Date  . BACK SURGERY    . CARPAL TUNNEL RELEASE    . CERVICAL FUSION    . COLONOSCOPY  1994   rectal bleeding  . DENTAL SURGERY     all teeth  . KNEE ARTHROSCOPY  2016   left; Dr. Shelle Iron  . NOSE SURGERY     broke nose / sinus cavity  . SHOULDER SURGERY  2016   arthroscopic for labral tear; left, Dr. Shelle Iron  . TONSILLECTOMY    . VASECTOMY         Home Medications    Prior to Admission medications   Medication Sig Start Date End Date Taking? Authorizing Provider  albuterol (PROVENTIL) (2.5 MG/3ML) 0.083% nebulizer solution Take 6 mLs (5 mg total) by nebulization once. 11/08/15   Kermit Balo Tysinger, PA-C  doxycycline (VIBRAMYCIN) 100 MG capsule Take 1 capsule (100 mg total) by mouth 2 (two) times daily. 11/06/15   Audry Pili, PA-C  EPINEPHrine 0.3 mg/0.3 mL IJ SOAJ injection Inject 0.3 mLs (0.3 mg total) into the muscle once. 10/17/15   Kermit Balo Tysinger, PA-C  fluticasone (FLONASE) 50 MCG/ACT nasal spray PLACE 2 SPRAYS INTO BOTH NOSTRILS DAILY. 04/16/16   Kermit Balo Tysinger, PA-C  mometasone-formoterol (DULERA) 100-5 MCG/ACT AERO INHALE 2 PUFFS INTO THE LUNGS 2 (TWO) TIMES DAILY. 10/27/15   Kermit Balo Tysinger, PA-C  oxyCODONE (OXY IR/ROXICODONE) 5 MG immediate release tablet  Take 5 mg by mouth at bedtime as needed for moderate pain.  10/26/15   Historical Provider, MD  oxymetazoline (AFRIN NASAL SPRAY) 0.05 % nasal spray Place 1 spray into both nostrils 2 (two) times daily. 11/06/15   Audry Pili, PA-C  predniSONE (STERAPRED UNI-PAK 48 TAB) 10 MG (48) TBPK tablet Take by mouth daily. Take as per manufacturer's recommendations 11/08/15   Ronnald Nian, MD  PROAIR HFA 108 [phone] Base) MCG/ACT inhaler INAHALE 2 PUFFS INTO THE LUNGS EVERY 6 HOURS AS NEEDED FOR WHEEZING OR SHORTNESS OF BREATH 03/19/16   Jac Canavan, PA-C    Family History Family History  Problem Relation Age of Onset  . Asthma Father   . Asthma Brother    . Cancer Daughter     ALL LEUKEMIA  . Cancer Maternal Aunt     breast  . Cancer Maternal Aunt     breast  . Cancer Maternal Aunt     breast  . Heart disease Mother 88    Afib  . Alcohol abuse Mother   . Thyroid disease Mother     died of thyroid complications  . Asthma Brother   . Diabetes Maternal Uncle   . Stroke Neg Hx     Social History Social History  Substance Use Topics  . Smoking status: Former Smoker    Packs/day: 1.00    Years: 20.00    Quit date: 09/25/1995  . Smokeless tobacco: Never Used  . Alcohol use No     Allergies   Penicillins; Vicodin [hydrocodone-acetaminophen]; Advair diskus [fluticasone-salmeterol]; Morphine and related; Prednisone; and Tramadol   Review of Systems Review of Systems  Musculoskeletal: Positive for arthralgias (left knee), gait problem (due to pain) and joint swelling (resolved left knee).  Skin: Negative for color change, rash and wound.     Physical Exam Updated Vital Signs BP 142/99 (BP Location: Right Arm)   Pulse 108   Temp 98.3 F (36.8 C) (Oral)   Resp 16   Ht 6\' 1"  (1.854 m)   Wt 113.4 kg   SpO2 100%   BMI 32.98 kg/m   Physical Exam  Constitutional: He is oriented to person, place, and time. He appears well-developed and well-nourished. No distress.  HENT:  Head: Normocephalic and atraumatic.  Eyes: EOM are normal.  Neck: Neck supple.  Cardiovascular: Normal rate.   Pulmonary/Chest: Effort normal. No respiratory distress.  Abdominal: He exhibits no distension.  Musculoskeletal: Normal range of motion.       Left knee: He exhibits no swelling, no deformity and no erythema. Tenderness found. LCL tenderness noted.  Left knee: No swelling or deformity of knee. Significant TTP of the LCL and IT band insertion. No significant laxity with varus or valgus movement. Minor pain with varus load. Difficulty to assess Lachman's test due to pain. No redness.   Neurological: He is alert and oriented to person, place,  and time.  Skin: Skin is warm and dry.  Psychiatric: He has a normal mood and affect. His behavior is normal.  Nursing note and vitals reviewed.    ED Treatments / Results  DIAGNOSTIC STUDIES: Oxygen Saturation is 100% on RA, nl by my interpretation.    COORDINATION OF CARE: 11:42 AM Discussed treatment plan with pt at bedside which includes left knee xray, ice, knee immobilizer, referral to orthopedist, and pt agreed to plan.   Radiology Dg Knee Complete 4 Views Left  Result Date: 04/18/2016 CLINICAL DATA:  Pain following twisting injury EXAM: LEFT  KNEE - COMPLETE 4+ VIEW COMPARISON:  September 01, 2014 FINDINGS: Frontal, lateral, and bilateral oblique views were obtained. There is no demonstrable fracture or dislocation. No joint effusion. The joint spaces appear normal. No erosive change. IMPRESSION: No fracture or joint effusion.  No apparent arthropathy. Electronically Signed   By: Bretta Bang III M.D.   On: 04/18/2016 11:06   Procedures Procedures (including critical care time)  Medications Ordered in ED Medications - No data to display   Initial Impression / Assessment and Plan / ED Course  I have reviewed the triage vital signs and the nursing notes.  Pertinent imaging results that were available during my care of the patient were reviewed by me and considered in my medical decision making (see chart for details).  Clinical Course    Final Clinical Impressions(s) / ED Diagnoses   Final diagnoses:  Knee sprain, left, initial encounter    Labs:   Imaging: Left knee xray: IMPRESSION: No fracture or joint effusion. No apparent arthropathy.   Consults:   Therapeutics:   Discharge Meds:   Assessment/Plan: Patient presents with left knee pain s/p mechanical injury, likely left LCL sprain. Cannot rule out ACL sprain. No infectious etiology. Referred to orthopedist for further evaluation. Will be given knee immobilizer while in the ED. Discharged home with  symptomatic care instructions. Patient will be discharged home & is agreeable with above plan. Returns precautions discussed. Pt appears safe for discharge.    New Prescriptions Discharge Medication List as of 04/18/2016 11:51 AM     I personally performed the services described in this documentation, which was scribed in my presence. The recorded information has been reviewed and is accurate.    Eyvonne Mechanic, PA-C 04/18/16 1652    Charlynne Pander, MD 04/19/16 2046

## 2016-09-10 ENCOUNTER — Telehealth: Payer: Self-pay | Admitting: Medical

## 2016-09-10 NOTE — Telephone Encounter (Signed)
His insurer sent me letter saying that [facility] S[facility]dulera will no longer be covered.  The options were Advair, Breo and Symbicort.   If he has tried these other prior, does he have a preference.  If so, let me know, and f/u in January once he has been on the new medication a month or so.   I know he has failed Advair.   What about Symbicort, Breo?

## 2016-09-11 NOTE — Telephone Encounter (Signed)
Pt  Wants to come in for an appt to talk to you about this .made an appt on 09/19/2016.

## 2016-09-19 ENCOUNTER — Ambulatory Visit (INDEPENDENT_AMBULATORY_CARE_PROVIDER_SITE_OTHER): Payer: BLUE CROSS/BLUE SHIELD | Admitting: Medical

## 2016-09-19 ENCOUNTER — Encounter: Payer: Self-pay | Admitting: Medical

## 2016-09-19 VITALS — BP 132/90 | HR 96 | Wt 246.4 lb

## 2016-09-19 DIAGNOSIS — Z2821 Immunization not carried out because of patient refusal: Secondary | ICD-10-CM | POA: Diagnosis not present

## 2016-09-19 DIAGNOSIS — J4540 Moderate persistent asthma, uncomplicated: Secondary | ICD-10-CM

## 2016-09-19 DIAGNOSIS — J309 Allergic rhinitis, unspecified: Secondary | ICD-10-CM

## 2016-09-19 DIAGNOSIS — Z87892 Personal history of anaphylaxis: Secondary | ICD-10-CM | POA: Diagnosis not present

## 2016-09-19 MED ORDER — FLUTICASONE FUROATE-VILANTEROL 200-25 MCG/INH IN AEPB: 1.0000 | INHALATION_SPRAY | Freq: Every day | RESPIRATORY_TRACT | 11 refills | Status: DC

## 2016-09-19 MED ORDER — EPINEPHRINE 0.3 MG/0.3ML IJ SOAJ: 0.3000 mg | Freq: Once | INTRAMUSCULAR | 1 refills | Status: AC

## 2016-09-19 MED ORDER — ALBUTEROL SULFATE (2.5 MG/3ML) 0.083% IN NEBU: 5.0000 mg | INHALATION_SOLUTION | Freq: Once | RESPIRATORY_TRACT | 1 refills | Status: DC

## 2016-09-19 MED ORDER — ALBUTEROL SULFATE (2.5 MG/3ML) 0.083% IN NEBU: 5.0000 mg | INHALATION_SOLUTION | Freq: Once | RESPIRATORY_TRACT | 2 refills | Status: DC

## 2016-09-19 MED ORDER — FLUTICASONE PROPIONATE 50 MCG/ACT NA SUSP: NASAL | 3 refills | Status: DC

## 2016-09-19 MED ORDER — FLUTICASONE PROPIONATE 50 MCG/ACT NA SUSP: NASAL | 11 refills | Status: DC

## 2016-09-19 MED ORDER — FLUTICASONE FUROATE-VILANTEROL 200-25 MCG/INH IN AEPB: 1.0000 | INHALATION_SPRAY | Freq: Every day | RESPIRATORY_TRACT | 3 refills | Status: DC

## 2016-09-19 MED ORDER — DOXYCYCLINE HYCLATE 100 MG PO CAPS: 100.0000 mg | ORAL_CAPSULE | Freq: Two times a day (BID) | ORAL | 0 refills | Status: DC

## 2016-09-19 NOTE — Progress Notes (Addendum)
Subjective: Chief Complaint  Patient presents with  . discuss meds for asthma    siscuss meds for asthma , possible vb12 shot    Here for f/u on asthma.   In general breathing ok,  Not perfect but not bad.  Currently has a chest cold.   Been using Dulera 2 puffs BID since 10/2015.   Hasn't used nebs at all since 10/2015, but albugterol uses maybe once monthly.  Working out daily, exercising, but has gained weight being out of work.   This certinaly affects his asthma.  Sinus issues flare up his asthma.  Saw ENT since last viist here, and nothing they can do to help.   Has had prior sinus surgery.  He hasn't been on other preventaive inhalers in a while. Has 5 pit bulls.    Wants B12 injection to help fight off the chest cold.  Has coughing, sinus pressure x 4 days.  No sick contacts.     Hasn't smoked in 20 years.  No recent GERD issues.  Been out of work since 03/2016 due to work injjry of left knee.  Uses Flonase nasal daily.   Uses zyrtec sometime, but doesn't like to take pills.   singualir didn't help last few times he tried it.  recenty [facility] which [facility] .  They will cover Advair, Breo, or symbicort.   He declines immunizations  Past Medical History:  Diagnosis Date  . Allergy   . Asthma   . Colonoscopy refused 10/2015  . Former smoker    quit 1994, prior was 20pack year+ smoker  . Influenza vaccination declined 10/2015  . Pneumococcal vaccination declined 10/2015   Current Outpatient Prescriptions on File Prior to Visit  Medication Sig Dispense Refill  . oxyCODONE (OXY IR/ROXICODONE) 5 MG immediate release tablet Take 5 mg by mouth at bedtime as needed for moderate pain.   0  . oxymetazoline (AFRIN NASAL SPRAY) 0.05 % nasal spray Place 1 spray into both nostrils 2 (two) times daily. (Patient not taking: Reported on 09/19/2016) 30 mL 0   No current facility-administered medications on file prior to visit.    Past Surgical History:   Procedure Laterality Date  . BACK SURGERY    . CARPAL TUNNEL RELEASE    . CERVICAL FUSION    . COLONOSCOPY  1994   rectal bleeding  . DENTAL SURGERY     all teeth  . KNEE ARTHROSCOPY  2016   left; Dr. Shelle IronBeane  . NOSE SURGERY     broke nose / sinus cavity  . SHOULDER SURGERY  2016   arthroscopic for labral tear; left, Dr. Shelle IronBeane  . TONSILLECTOMY    . VASECTOMY     ROS as in subjective   Objective: BP 132/90   Pulse 96   Wt 246 lb 6.4 oz (111.8 kg)   SpO2 96%   BMI 32.51 kg/m   General appearance: Alert, WD/WN, no distress                             Skin: warm, no rash, no diaphoresis                           Head: no sinus tenderness                            Eyes:  conjunctiva normal, corneas clear, PERRLA                            Ears: pearly TMs, external ear canals normal                          Nose: septum midline, turbinates  with erythema and clear discharge             Mouth/throat: MMM, tongue normal, mild pharyngeal erythema                           Neck: supple, no adenopathy, no thyromegaly, non tender                          Heart: RRR, normal S1, S2, no murmurs                         Lungs: +bronchial breath sounds, +scattered rhonchi, few wheezes, no rales                Extremities: no edema, non tender        Assessment: Encounter Diagnoses  Name Primary?  . Moderate persistent asthma in adult without complication Yes  . Allergic rhinitis, unspecified chronicity, unspecified seasonality, unspecified trigger   . History of anaphylaxis   . Immunization refused      Plan: Begin doxycycline, hydration, rest, mucinex DM for current respiratoyr infection  Change to [employer] given insurance denial for [facility] although he has done relatively well on this since 10/2015.    Avoid allergen triggers, GERD, and work on losing weight  C/t albuterol prin  epipen refilled for prn use.  disucssed proper use  C/t flonase, consider using zyrtec year  round  He declines immunizations and PFTs today  Fayrene FearingJames was seen today for discuss meds for asthma.  Diagnoses and all orders for this visit:  Moderate persistent asthma in adult without complication  Allergic rhinitis, unspecified chronicity, unspecified seasonality, unspecified trigger  History of anaphylaxis  Immunization refused  Other orders -     doxycycline (VIBRAMYCIN) 100 MG capsule; Take 1 capsule (100 mg total) by mouth 2 (two) times daily. -     Discontinue: albuterol (PROVENTIL) (2.5 MG/3ML) 0.083% nebulizer solution; Take 6 mLs (5 mg total) by nebulization once. -     Discontinue: fluticasone (FLONASE) 50 MCG/ACT nasal spray; PLACE 2 SPRAYS INTO BOTH NOSTRILS DAILY. -     EPINEPHrine 0.3 mg/0.3 mL IJ SOAJ injection; Inject 0.3 mLs (0.3 mg total) into the muscle once. -     Discontinue: fluticasone furoate-vilanterol (BREO ELLIPTA) 200-25 MCG/INH AEPB; Inhale 1 puff into the lungs daily. -     albuterol (PROVENTIL) (2.5 MG/3ML) 0.083% nebulizer solution; Take 6 mLs (5 mg total) by nebulization once. -     fluticasone (FLONASE) 50 MCG/ACT nasal spray; PLACE 2 SPRAYS INTO BOTH NOSTRILS DAILY. -     fluticasone furoate-vilanterol (BREO ELLIPTA) 200-25 MCG/INH AEPB; Inhale 1 puff into the lungs daily.

## 2016-09-19 NOTE — Addendum Note (Signed)
Addended by: Jac CanavanYSINGER, Layli Capshaw S on: 09/19/2016 11:33 AM   Modules accepted: Orders

## 2016-10-12 ENCOUNTER — Telehealth: Payer: Self-pay | Admitting: Family Medicine

## 2016-10-15 NOTE — Telephone Encounter (Signed)
dt ?

## 2016-10-16 ENCOUNTER — Telehealth: Payer: Self-pay | Admitting: Medical

## 2016-10-16 NOTE — Telephone Encounter (Signed)
Recv'd fax from [facility] stating Dulera not covered, please switch to Symbicort or Breo.  Do you want to switch?

## 2016-10-16 NOTE — Telephone Encounter (Signed)
I don't understand, [facility] last message few weeks ago.

## 2016-10-24 NOTE — Telephone Encounter (Signed)
Call[facility] & [facility] & [facility]

## 2016-11-14 ENCOUNTER — Encounter: Payer: Self-pay | Admitting: Medical

## 2016-11-19 ENCOUNTER — Telehealth: Payer: Self-pay | Admitting: Medical

## 2016-11-19 NOTE — Telephone Encounter (Signed)
I received e-mail about refilling tizanidine.  Is he seeing a back specialist?  If not, needs appt to discuss.

## 2016-11-19 NOTE — Telephone Encounter (Signed)
Kristopher Gomez talk to pt he said that he can't come in back he doesn't have the money because his wife took all his money out of his bank acct. He will call back another time to make an appt.

## 2016-11-19 NOTE — Telephone Encounter (Signed)
Ok lets get him in for appt regarding tizanidine

## 2016-11-19 NOTE — Telephone Encounter (Signed)
He said that seeing  Othro. For knees and legs

## 2016-11-19 NOTE — Telephone Encounter (Signed)
Called l/m for pt call us back about an appt.

## 2016-11-20 NOTE — Telephone Encounter (Signed)
Ok, sorry to hear, but unfortunately this is not a medication I can just refill without dicussing, particularly regarding 90 day.   appt when he has the ability to come

## 2017-01-09 ENCOUNTER — Other Ambulatory Visit: Payer: Self-pay | Admitting: Medical

## 2017-01-09 ENCOUNTER — Telehealth: Payer: Self-pay | Admitting: Medical

## 2017-01-09 DIAGNOSIS — R7301 Impaired fasting glucose: Secondary | ICD-10-CM

## 2017-01-09 DIAGNOSIS — Z0000 Encounter for general adult medical examination without abnormal findings: Secondary | ICD-10-CM | POA: Insufficient documentation

## 2017-01-09 NOTE — Telephone Encounter (Signed)
Pt coming in on 01/15/17 to discuss applying for life insurance and get lab work for life insurance. He wants to know if he should come in this week before the appointment so that fasting lab results are back when he comes in for his appointment.    Also pt wants it noted that his wife can not be in the office when he is here due to a 50B order

## 2017-01-09 NOTE — Telephone Encounter (Signed)
Lab order is in

## 2017-01-10 NOTE — Telephone Encounter (Signed)
Advised pt and he will come in for labs on 01-11-17

## 2017-01-11 ENCOUNTER — Other Ambulatory Visit: Payer: BLUE CROSS/BLUE SHIELD

## 2017-01-11 DIAGNOSIS — R7301 Impaired fasting glucose: Secondary | ICD-10-CM

## 2017-01-11 DIAGNOSIS — Z0000 Encounter for general adult medical examination without abnormal findings: Secondary | ICD-10-CM

## 2017-01-11 LAB — COMPREHENSIVE METABOLIC PANEL
ALBUMIN: 4.4 g/dL (ref 3.6–5.1)
ALK PHOS: 70 U/L (ref 40–115)
ALT: 20 U/L (ref 9–46)
AST: 24 U/L (ref 10–35)
BUN: 18 mg/dL (ref 7–25)
CHLORIDE: 104 mmol/L (ref 98–110)
CO2: 23 mmol/L (ref 20–31)
Calcium: 9.7 mg/dL (ref 8.6–10.3)
Creat: 1.1 mg/dL (ref 0.70–1.33)
Glucose, Bld: 80 mg/dL (ref 65–99)
POTASSIUM: 4.6 mmol/L (ref 3.5–5.3)
SODIUM: 139 mmol/L (ref 135–146)
Total Bilirubin: 0.7 mg/dL (ref 0.2–1.2)
Total Protein: 6.5 g/dL (ref 6.1–8.1)

## 2017-01-11 LAB — LIPID PANEL
CHOLESTEROL: 172 mg/dL (ref <200)
HDL: 48 mg/dL (ref >40)
LDL CALC: 110 mg/dL — AB (ref <100)
TRIGLYCERIDES: 71 mg/dL (ref <150)
Total CHOL/HDL Ratio: 3.6 Ratio (ref <5.0)
VLDL: 14 mg/dL (ref <30)

## 2017-01-11 LAB — CBC
HEMATOCRIT: 46.8 % (ref 38.5–50.0)
HEMOGLOBIN: 15.4 g/dL (ref 13.2–17.1)
MCH: 28 pg (ref 27.0–33.0)
MCHC: 32.9 g/dL (ref 32.0–36.0)
MCV: 85.1 fL (ref 80.0–100.0)
MPV: 11.4 fL (ref 7.5–12.5)
Platelets: 233 10*3/uL (ref 140–400)
RBC: 5.5 MIL/uL (ref 4.20–5.80)
RDW: 13.5 % (ref 11.0–15.0)
WBC: 12.2 10*3/uL — ABNORMAL HIGH (ref 4.0–10.5)

## 2017-01-12 LAB — HEMOGLOBIN A1C
Hgb A1c MFr Bld: 5.4 % (ref <5.7)
Mean Plasma Glucose: 108 mg/dL

## 2017-01-15 ENCOUNTER — Encounter: Payer: Self-pay | Admitting: Medical

## 2017-01-15 ENCOUNTER — Ambulatory Visit (INDEPENDENT_AMBULATORY_CARE_PROVIDER_SITE_OTHER): Payer: BLUE CROSS/BLUE SHIELD | Admitting: Medical

## 2017-01-15 VITALS — BP 144/96 | HR 75 | Wt 222.4 lb

## 2017-01-15 DIAGNOSIS — Z0000 Encounter for general adult medical examination without abnormal findings: Secondary | ICD-10-CM | POA: Diagnosis not present

## 2017-01-15 DIAGNOSIS — J309 Allergic rhinitis, unspecified: Secondary | ICD-10-CM

## 2017-01-15 DIAGNOSIS — M1710 Unilateral primary osteoarthritis, unspecified knee: Secondary | ICD-10-CM

## 2017-01-15 DIAGNOSIS — D72829 Elevated white blood cell count, unspecified: Secondary | ICD-10-CM

## 2017-01-15 DIAGNOSIS — M179 Osteoarthritis of knee, unspecified: Secondary | ICD-10-CM | POA: Insufficient documentation

## 2017-01-15 DIAGNOSIS — J4540 Moderate persistent asthma, uncomplicated: Secondary | ICD-10-CM | POA: Diagnosis not present

## 2017-01-15 DIAGNOSIS — Z1211 Encounter for screening for malignant neoplasm of colon: Secondary | ICD-10-CM

## 2017-01-15 DIAGNOSIS — Z630 Problems in relationship with spouse or partner: Secondary | ICD-10-CM

## 2017-01-15 DIAGNOSIS — Z87892 Personal history of anaphylaxis: Secondary | ICD-10-CM

## 2017-01-15 DIAGNOSIS — Z7189 Other specified counseling: Secondary | ICD-10-CM

## 2017-01-15 DIAGNOSIS — R7301 Impaired fasting glucose: Secondary | ICD-10-CM

## 2017-01-15 DIAGNOSIS — Z2821 Immunization not carried out because of patient refusal: Secondary | ICD-10-CM

## 2017-01-15 DIAGNOSIS — Z8349 Family history of other endocrine, nutritional and metabolic diseases: Secondary | ICD-10-CM

## 2017-01-15 DIAGNOSIS — Z7185 Encounter for immunization safety counseling: Secondary | ICD-10-CM

## 2017-01-15 MED ORDER — PROAIR HFA 108 (90 BASE) MCG/ACT IN AERS: INHALATION_SPRAY | RESPIRATORY_TRACT | 1 refills | Status: DC

## 2017-01-15 MED ORDER — ALBUTEROL SULFATE (2.5 MG/3ML) 0.083% IN NEBU: 5.0000 mg | INHALATION_SOLUTION | Freq: Once | RESPIRATORY_TRACT | 1 refills | Status: DC

## 2017-01-15 MED ORDER — FLUTICASONE FUROATE-VILANTEROL 200-25 MCG/INH IN AEPB: 1.0000 | INHALATION_SPRAY | Freq: Every day | RESPIRATORY_TRACT | 3 refills | Status: DC
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
## 2017-01-16 ENCOUNTER — Telehealth: Payer: Self-pay | Admitting: Medical

## 2017-01-16 NOTE — Telephone Encounter (Signed)
Recv'd fax ins requires 90 day for Proair, ok per Dorinda Hill.  Called pharmacy and approved

## 2017-01-18 ENCOUNTER — Encounter: Payer: Self-pay | Admitting: Medical

## 2017-01-28 NOTE — Telephone Encounter (Signed)
Kristopher Gomez, this patient states insurance is going to call needing additional info. Just FYI

## 2017-02-07 ENCOUNTER — Encounter: Payer: Self-pay | Admitting: Medical

## 2017-04-27 ENCOUNTER — Encounter: Payer: Self-pay | Admitting: Medical

## 2017-07-08 ENCOUNTER — Other Ambulatory Visit: Payer: Self-pay | Admitting: Medical

## 2017-07-24 ENCOUNTER — Ambulatory Visit (INDEPENDENT_AMBULATORY_CARE_PROVIDER_SITE_OTHER): Payer: BLUE CROSS/BLUE SHIELD | Admitting: Family Medicine

## 2017-07-24 ENCOUNTER — Encounter: Payer: Self-pay | Admitting: Family Medicine

## 2017-07-24 ENCOUNTER — Encounter: Payer: Self-pay | Admitting: Medical

## 2017-07-24 VITALS — BP 110/76 | HR 71 | Temp 97.9°F | Resp 16 | Wt 227.4 lb

## 2017-07-24 DIAGNOSIS — J309 Allergic rhinitis, unspecified: Secondary | ICD-10-CM | POA: Diagnosis not present

## 2017-07-24 DIAGNOSIS — J019 Acute sinusitis, unspecified: Secondary | ICD-10-CM

## 2017-07-24 MED ORDER — FLUTICASONE FUROATE-VILANTEROL 200-25 MCG/INH IN AEPB: 1.0000 | INHALATION_SPRAY | Freq: Every day | RESPIRATORY_TRACT | 0 refills | Status: DC

## 2017-07-24 MED ORDER — DOXYCYCLINE HYCLATE 100 MG PO TABS: 100.0000 mg | ORAL_TABLET | Freq: Two times a day (BID) | ORAL | 0 refills | Status: DC

## 2017-07-24 MED ORDER — ALBUTEROL SULFATE HFA 108 (90 BASE) MCG/ACT IN AERS: INHALATION_SPRAY | RESPIRATORY_TRACT | 0 refills | Status: DC

## 2017-07-24 NOTE — Patient Instructions (Addendum)
Drink plenty of water. Continue sinus rinses at least 1-2 times/day Consider using decongestant (sudafed) to help with sinus pain and dry up drainage. You can also use an antihistamine (ie claritin, allergra, zyrtec) to help with the runny nose from allergies.  Take the doxycycline twice daily for 10 days.  If a week into it you are not any better, or if worse, we may need to change the antibiotics.  Consider using mucinex especially if it gets down into the chest, causing chest congestion and increased wheezing.   Sinusitis, Adult Sinusitis is soreness and inflammation of your sinuses. Sinuses are hollow spaces in the bones around your face. Your sinuses are located:  Around your eyes.  In the middle of your forehead.  Behind your nose.  In your cheekbones.  Your sinuses and nasal passages are lined with a stringy fluid (mucus). Mucus normally drains out of your sinuses. When your nasal tissues become inflamed or swollen, the mucus can become trapped or blocked so air cannot flow through your sinuses. This allows bacteria, viruses, and funguses to grow, which leads to infection. Sinusitis can develop quickly and last for 7?10 days (acute) or for more than 12 weeks (chronic). Sinusitis often develops after a cold. What are the causes? This condition is caused by anything that creates swelling in the sinuses or stops mucus from draining, including:  Allergies.  Asthma.  Bacterial or viral infection.  Abnormally shaped bones between the nasal passages.  Nasal growths that contain mucus (nasal polyps).  Narrow sinus openings.  Pollutants, such as chemicals or irritants in the air.  A foreign object stuck in the nose.  A fungal infection. This is rare.  What increases the risk? The following factors may make you more likely to develop this condition:  Having allergies or asthma.  Having had a recent cold or respiratory tract infection.  Having structural deformities  or blockages in your nose or sinuses.  Having a weak immune system.  Doing a lot of swimming or diving.  Overusing nasal sprays.  Smoking.  What are the signs or symptoms? The main symptoms of this condition are pain and a feeling of pressure around the affected sinuses. Other symptoms include:  Upper toothache.  Earache.  Headache.  Bad breath.  Decreased sense of smell and taste.  A cough that may get worse at night.  Fatigue.  Fever.  Thick drainage from your nose. The drainage is often green and it may contain pus (purulent).  Stuffy nose or congestion.  Postnasal drip. This is when extra mucus collects in the throat or back of the nose.  Swelling and warmth over the affected sinuses.  Sore throat.  Sensitivity to light.  How is this diagnosed? This condition is diagnosed based on symptoms, a medical history, and a physical exam. To find out if your condition is acute or chronic, your health care provider may:  Look in your nose for signs of nasal polyps.  Tap over the affected sinus to check for signs of infection.  View the inside of your sinuses using an imaging device that has a light attached (endoscope).  If your health care provider suspects that you have chronic sinusitis, you may also:  Be tested for allergies.  Have a sample of mucus taken from your nose (nasal culture) and checked for bacteria.  Have a mucus sample examined to see if your sinusitis is related to an allergy.  If your sinusitis does not respond to treatment and it lasts  longer than 8 weeks, you may have an MRI or CT scan to check your sinuses. These scans also help to determine how severe your infection is. In rare cases, a bone biopsy may be done to rule out more serious types of fungal sinus disease. How is this treated? Treatment for sinusitis depends on the cause and whether your condition is chronic or acute. If a virus is causing your sinusitis, your symptoms will go  away on their own within 10 days. You may be given medicines to relieve your symptoms, including:  Topical nasal decongestants. They shrink swollen nasal passages and let mucus drain from your sinuses.  Antihistamines. These drugs block inflammation that is triggered by allergies. This can help to ease swelling in your nose and sinuses.  Topical nasal corticosteroids. These are nasal sprays that ease inflammation and swelling in your nose and sinuses.  Nasal saline washes. These rinses can help to get rid of thick mucus in your nose.  If your condition is caused by bacteria, you will be given an antibiotic medicine. If your condition is caused by a fungus, you will be given an antifungal medicine. Surgery may be needed to correct underlying conditions, such as narrow nasal passages. Surgery may also be needed to remove polyps. Follow these instructions at home: Medicines  Take, use, or apply over-the-counter and prescription medicines only as told by your health care provider. These may include nasal sprays.  If you were prescribed an antibiotic medicine, take it as told by your health care provider. Do not stop taking the antibiotic even if you start to feel better. Hydrate and Humidify  Drink enough water to keep your urine clear or pale yellow. Staying hydrated will help to thin your mucus.  Use a cool mist humidifier to keep the humidity level in your home above 50%.  Inhale steam for 10-15 minutes, 3-4 times a day or as told by your health care provider. You can do this in the bathroom while a hot shower is running.  Limit your exposure to cool or dry air. Rest  Rest as much as possible.  Sleep with your head raised (elevated).  Make sure to get enough sleep each night. General instructions  Apply a warm, moist washcloth to your face 3-4 times a day or as told by your health care provider. This will help with discomfort.  Wash your hands often with soap and water to reduce  your exposure to viruses and other germs. If soap and water are not available, use hand sanitizer.  Do not smoke. Avoid being around people who are smoking (secondhand smoke).  Keep all follow-up visits as told by your health care provider. This is important. Contact a health care provider if:  You have a fever.  Your symptoms get worse.  Your symptoms do not improve within 10 days. Get help right away if:  You have a severe headache.  You have persistent vomiting.  You have pain or swelling around your face or eyes.  You have vision problems.  You develop confusion.  Your neck is stiff.  You have trouble breathing. This information is not intended to replace advice given to you by your health care provider. Make sure you discuss any questions you have with your health care provider. Document Released: 09/10/2005 Document Revised: 05/06/2016 Document Reviewed: 07/06/2015 Elsevier Interactive Patient Education  2017 [employer].

## 2017-07-24 NOTE — Progress Notes (Signed)
Chief Complaint  Patient presents with  . URI    URI for about a month.  symptoms- yellow mucous, sinus pressure, headache, just stopped up    About 2 months ago his allergies started flaring, runny nose. He restarted regular use of Flonase, which didn't seem to help.  He has gotten worse.  He notices discolored nasal drainage, yellow, and frontal sinus pressure.  Denies sore throat or cough. Denies any significant asthma flare, used inhaler twice last week, but feels fine now.  Neti pot results in thick chunky drainage, and only temporary relief of discomfort. He has used decongestant intermittently, denies side effect or contraindication.  PMH, PSH, SH reviewed  Outpatient Encounter Prescriptions as of 07/24/2017  Medication Sig Note  . albuterol (PROAIR HFA) 108 (90 Base) MCG/ACT inhaler INHALE 2 PUFFS EVERY 6 HOURS AS NEEDED FOR WHEEZE/SHORTNESS OF BREATH 07/24/2017: Needed twice last week  . fluticasone (FLONASE) 50 MCG/ACT nasal spray PLACE 2 SPRAYS INTO BOTH NOSTRILS DAILY. 07/24/2017: Restarted last month  . fluticasone furoate-vilanterol (BREO ELLIPTA) 200-25 MCG/INH AEPB Inhale 1 puff into the lungs daily.   Marland Kitchen tiZANidine (ZANAFLEX) 4 MG capsule TAKE 1 CAPSULE EVERY 6 HOURS AS NEEDED FOR SPASMS 07/24/2017: Uses at bedtime  . albuterol (PROVENTIL) (2.5 MG/3ML) 0.083% nebulizer solution Take 6 mLs (5 mg total) by nebulization once.    No facility-administered encounter medications on file as of 07/24/2017.    Allergies  Allergen Reactions  . Penicillins Anaphylaxis and Swelling    Has patient had a PCN reaction causing immediate rash, facial/tongue/throat swelling, SOB or lightheadedness with hypotension: Yes Has patient had a PCN reaction causing severe rash involving mucus membranes or skin necrosis: No Has patient had a PCN reaction that required hospitalization No Has patient had a PCN reaction occurring within the last 10 years: Yes If all of the above answers are "NO", then  may proceed with Cephalosporin use.   . Vicodin [Hydrocodone-Acetaminophen] Anaphylaxis  . Advair Diskus [Fluticasone-Salmeterol]     Can take Symbicort  . Morphine And Related Nausea And Vomiting  . Prednisone Other (See Comments)    Wife says he is not pleasant with this  . Tramadol Nausea And Vomiting   ROS: Denies fever, has had some chills.  Denies myalgias, rashes.  Denies GI symptoms, chest pain, palpitations, shortness of breath. See HPI  PHYSICAL EXAM: BP 110/76   Pulse 71   Temp 97.9 F (36.6 C) (Oral)   Resp 16   Wt 227 lb 6.4 oz (103.1 kg)   SpO2 97%   BMI 30.00 kg/m   Well appearing, overweight male, in no distress HEENT: PERRL, EOMI, conjunctiva and sclera are clear. TM's and EAC'snormal. Nasal mucosa has mod edema, +erythema with clear-white mucus. OP is clear. Tender over bilateral maxillary and bilateral frontal sinuses Neck: no lymphadenopathy or mass Heart: regular rate and rhythm Lungs: clear bilaterally, no wheezing Psych: normal mood, affect, hygiene and grooming Neuro: alert and oriented, cranial nerves intact, normal gait  ASSESSMENT/PLAN:   Acute non-recurrent sinusitis, unspecified location - previously did well with doxy. Encouraged sinus rinses, decongestant and mucinex prn. f/u in 7-10d if not better or worse. - Plan: doxycycline (VIBRA-TABS) 100 MG tablet  Allergic rhinitis, unspecified seasonality, unspecified trigger - Continue current treatment   Refuses flu shots (since daughter died 10 years ago). Recommended given his dx of asthma; refuses.     Drink plenty of water. Continue sinus rinses at least 1-2 times/day Consider using decongestant (sudafed) to help with  sinus pain and dry up drainage. You can also use an antihistamine (ie claritin, allergra, zyrtec) to help with the runny nose from allergies.  Take the doxycycline twice daily for 10 days.  If a week into it you are not any better, or if worse, we may need to change the  antibiotics.  Consider using mucinex especially if it gets down into the chest, causing chest congestion and increased wheezing.

## 2017-07-26 ENCOUNTER — Telehealth: Payer: Self-pay | Admitting: Medical

## 2017-07-26 ENCOUNTER — Other Ambulatory Visit: Payer: Self-pay | Admitting: Medical

## 2017-07-26 NOTE — Telephone Encounter (Signed)
pls check with pharmacy.  According the the mg amount sent, it would have been 90 day supply.

## 2017-07-26 NOTE — Telephone Encounter (Signed)
Pharmacy called & left message that ins requires Pro-air be written as 90 day supply

## 2017-07-26 NOTE — Telephone Encounter (Signed)
Called pharmacy & the Rx was for 90 days

## 2017-07-30 ENCOUNTER — Other Ambulatory Visit: Payer: Self-pay | Admitting: Medical

## 2017-08-09 ENCOUNTER — Ambulatory Visit: Payer: BLUE CROSS/BLUE SHIELD | Admitting: Medical

## 2017-08-09 VITALS — BP 130/90 | HR 76 | Wt 226.4 lb

## 2017-08-09 DIAGNOSIS — Z2821 Immunization not carried out because of patient refusal: Secondary | ICD-10-CM | POA: Diagnosis not present

## 2017-08-09 DIAGNOSIS — J309 Allergic rhinitis, unspecified: Secondary | ICD-10-CM

## 2017-08-09 DIAGNOSIS — J4540 Moderate persistent asthma, uncomplicated: Secondary | ICD-10-CM | POA: Diagnosis not present

## 2017-08-09 DIAGNOSIS — J342 Deviated nasal septum: Secondary | ICD-10-CM | POA: Diagnosis not present

## 2017-08-09 MED ORDER — BECLOMETHASONE DIPROPIONATE 80 MCG/ACT NA AERS: 1.0000 | INHALATION_SPRAY | Freq: Two times a day (BID) | NASAL | 3 refills | Status: AC

## 2017-08-09 MED ORDER — ALBUTEROL SULFATE (2.5 MG/3ML) 0.083% IN NEBU: 5.0000 mg | INHALATION_SOLUTION | Freq: Once | RESPIRATORY_TRACT | 1 refills | Status: DC

## 2017-08-09 NOTE — Progress Notes (Signed)
Subjective: Chief Complaint  Patient presents with  . asthma issues    wants asthma test done   Here for asthma recheck.  Current asthma medications: taking Breo inhaler daily, has albuterol HFA and neb at home.   Using albuterol 3 times last week.  In past month or 2 not having to use inhaler regularly.   Breo really helps.   Stress reduction has helped a lot.    Nonsmoker, been quit 22 years.   Was seen here 07/24/17 for sinusitis, saw Dr. Lynelle DoctorKnapp.  Just finished antibiotics 2 days ago.  Doing ok.     Still deals a lot with sinus problems. Uses neti pot regularly.     In the past hasn't done well on most allergy and asthma medication including no real improvements on singular, Advair, decongestants, allergy pills  Declines flu and pneumococcal vaccine.  Past Medical History:  Diagnosis Date  . Allergy   . Asthma   . Colonoscopy refused 10/2015  . Former smoker    quit 1994, prior was 20pack year+ smoker  . Influenza vaccination declined 10/2015  . Pneumococcal vaccination declined 10/2015   Current Outpatient Medications on File Prior to Visit  Medication Sig Dispense Refill  . albuterol (PROAIR HFA) 108 (90 Base) MCG/ACT inhaler INHALE 2 PUFFS EVERY 6 HOURS AS NEEDED FOR WHEEZE/SHORTNESS OF BREATH 54 g 0  . fluticasone (FLONASE) 50 MCG/ACT nasal spray PLACE 2 SPRAYS INTO BOTH NOSTRILS DAILY. 16 g 0  . fluticasone furoate-vilanterol (BREO ELLIPTA) 200-25 MCG/INH AEPB Inhale 1 puff into the lungs daily. 84 each 0  . tiZANidine (ZANAFLEX) 4 MG capsule TAKE 1 CAPSULE EVERY 6 HOURS AS NEEDED FOR SPASMS  1  . albuterol (PROVENTIL) (2.5 MG/3ML) 0.083% nebulizer solution Take 6 mLs (5 mg total) by nebulization once. 150 mL 1   No current facility-administered medications on file prior to visit.    ROS as in subjective   Objective: BP 130/90   Pulse 76   Wt 226 lb 6.4 oz (102.7 kg)   SpO2 97%   BMI 29.87 kg/m   BP Readings from Last 3 Encounters:  08/09/17 130/90  07/24/17  110/76  01/15/17 (!) 144/96   General appearance: alert, no distress, WD/WN,  HEENT: normocephalic, sclerae anicteric, TMs pearly, nares patent, slight erythema, no discharge, pharynx normal Oral cavity: MMM, no lesions Neck: supple, no lymphadenopathy, no thyromegaly, no masses Heart: RRR, normal S1, S2, no murmurs Lungs: mildly decreased breath sounds in general, no wheezes, rhonchi, or rales Pulses: 2+ symmetric, upper and lower extremities, normal cap refill     Assessment: Encounter Diagnoses  Name Primary?  . Moderate persistent asthma in adult without complication Yes  . Allergic rhinitis, unspecified seasonality, unspecified trigger   . Deviated septum   . 23-polyvalent pneumococcal polysaccharide vaccine declined   . Influenza vaccination declined       Plan: PFT reviewed.  Doing well on current regimen.    C/t Breo daily, albuterol prn.   Prefers Ventolin brand.  He notes lots of problems with malfunctions with Pro air device.   C/t neb albuterol prn.   Begin trial of Qnasal nasal spray, has failed nasonex, Flonase, Singulair, zyrtec, and other allergy medications  Declines vaccines.  Kristopher Gomez was seen today for asthma issues.  Diagnoses and all orders for this visit:  Moderate persistent asthma in adult without complication  Allergic rhinitis, unspecified seasonality, unspecified trigger  Deviated septum  23-polyvalent pneumococcal polysaccharide vaccine declined  Influenza vaccination declined

## 2017-08-10 ENCOUNTER — Encounter: Payer: Self-pay | Admitting: Medical

## 2017-08-12 ENCOUNTER — Telehealth: Payer: Self-pay | Admitting: Medical

## 2017-08-12 NOTE — Telephone Encounter (Signed)
No, he has already failed Nasonex, Rhincort, Flonase.   I think he would respond better to a dry powder inhaler, instead of liquid sprays.  Please try to push throughout or override to get this covered (Qnasal)

## 2017-08-12 NOTE — Telephone Encounter (Signed)
Recv'd fax from [facility] states Qnasl not covered by insurance, pt has tried Flonase and request switch to alternative Flunisolide 0.025%, Fluticasone prop 50 mcg, Mometasone furoate 50 mcg spray.  Do you want to switch to either of these?

## 2017-08-21 ENCOUNTER — Encounter: Payer: Self-pay | Admitting: Medical

## 2017-08-22 ENCOUNTER — Telehealth: Payer: Self-pay | Admitting: Medical

## 2017-08-22 NOTE — Telephone Encounter (Signed)
Any updates on Qnasal medication since he has failed other options?  See other message.

## 2017-08-23 MED ORDER — ALBUTEROL SULFATE (2.5 MG/3ML) 0.083% IN NEBU: INHALATION_SOLUTION | RESPIRATORY_TRACT | 1 refills | Status: AC

## 2017-08-23 NOTE — Telephone Encounter (Signed)
P.A. QNASL 

## 2017-08-28 ENCOUNTER — Telehealth: Payer: Self-pay | Admitting: Medical

## 2017-08-28 ENCOUNTER — Other Ambulatory Visit: Payer: Self-pay | Admitting: Medical

## 2017-08-28 MED ORDER — AZELASTINE-FLUTICASONE 137-50 MCG/ACT NA SUSP: 1.0000 | Freq: Two times a day (BID) | NASAL | 0 refills | Status: DC

## 2017-08-28 NOTE — Telephone Encounter (Signed)
See other telephone call Initiated P.A. Qnasl

## 2017-08-28 NOTE — Telephone Encounter (Signed)
P.A. QNASL denied. After multiple telephone calls to insurance company finally obtained correct phone number to pt's correct pharmacy benefits handler [facility] T# [phone].  Due to Qnasl being plan exclusion unable to do P.A. But can do appeal they are faxing appeal paperwork.  [employer] adivsed there is another covered alternative, Dymista.  Kristopher Gomez is Dymista an option that you could switch to or do you want me to file an appeal?

## 2017-08-28 NOTE — Telephone Encounter (Signed)
I sent Dymista spray to try to see if this works better than his current nasal spray.  This has 2 medications in 1, a nasal antihistamine and nasal steroid.  He can try this out as a next nasal spray option.  Its a good option as insurance excluded Qnasal.

## 2017-08-29 ENCOUNTER — Other Ambulatory Visit: Payer: Self-pay | Admitting: Medical

## 2017-08-29 MED ORDER — AZELASTINE-FLUTICASONE 137-50 MCG/ACT NA SUSP: 1.0000 | Freq: Two times a day (BID) | NASAL | 0 refills | Status: DC

## 2017-08-29 NOTE — Telephone Encounter (Signed)
Called pt & informed and he is ok trying the Dymista.  Patient states Rx needs to be a 90 day supply that is the only way insurance will pay for it.  Please change to 90 days

## 2017-09-12 ENCOUNTER — Encounter: Payer: Self-pay | Admitting: Medical

## 2017-09-20 ENCOUNTER — Other Ambulatory Visit: Payer: Self-pay | Admitting: Medical

## 2017-09-20 MED ORDER — FLUTICASONE PROPIONATE 50 MCG/ACT NA SUSP: NASAL | 0 refills | Status: DC

## 2017-11-29 ENCOUNTER — Other Ambulatory Visit: Payer: Self-pay | Admitting: Medical

## 2017-11-29 NOTE — Telephone Encounter (Signed)
Called and l/m for pt called  Pt call us to make an appt .

## 2017-12-11 ENCOUNTER — Other Ambulatory Visit: Payer: Self-pay | Admitting: Medical

## 2017-12-11 ENCOUNTER — Encounter: Payer: Self-pay | Admitting: Medical

## 2017-12-11 MED ORDER — FLUTICASONE PROPIONATE 50 MCG/ACT NA SUSP
NASAL | 2 refills | Status: AC
Start: 2017-12-11 — End: ?

## 2017-12-12 ENCOUNTER — Other Ambulatory Visit: Payer: Self-pay | Admitting: Medical

## 2017-12-12 MED ORDER — AZELASTINE-FLUTICASONE 137-50 MCG/ACT NA SUSP: 1.0000 | Freq: Two times a day (BID) | NASAL | 0 refills | Status: DC

## 2017-12-12 NOTE — Telephone Encounter (Signed)
Please advise if D[facility] can be fill for [facility] at [facility] . [facility]

## 2018-02-21 ENCOUNTER — Other Ambulatory Visit: Payer: Self-pay | Admitting: Medical

## 2018-03-06 ENCOUNTER — Other Ambulatory Visit: Payer: Self-pay | Admitting: Medical

## 2018-03-13 ENCOUNTER — Other Ambulatory Visit: Payer: Self-pay | Admitting: Medical

## 2018-03-13 NOTE — Telephone Encounter (Signed)
Please advise if this ok to refill?

## 2018-05-22 ENCOUNTER — Other Ambulatory Visit: Payer: Self-pay | Admitting: Medical

## 2018-05-22 NOTE — Telephone Encounter (Signed)
Send refill, get in for yearly physical and fasting labs

## 2018-05-22 NOTE — Telephone Encounter (Signed)
Is this ok to refill?
